# Patient Record
Sex: Female | Born: 1955 | Race: White | Hispanic: Yes | Marital: Single | Smoking: Never smoker
Health system: Southern US, Community
[De-identification: ages and names within clinical notes are randomized; demographics above are authoritative.]

## PROBLEM LIST (undated history)

## (undated) DIAGNOSIS — M1990 Unspecified osteoarthritis, unspecified site: Secondary | ICD-10-CM

## (undated) DIAGNOSIS — E785 Hyperlipidemia, unspecified: Secondary | ICD-10-CM

## (undated) HISTORY — DX: Unspecified osteoarthritis, unspecified site: M19.90

## (undated) HISTORY — PX: ANKLE SURGERY: SHX546

## (undated) HISTORY — DX: Hyperlipidemia, unspecified: E78.5

---

## 1993-05-18 DIAGNOSIS — I639 Cerebral infarction, unspecified: Secondary | ICD-10-CM

## 1993-05-18 HISTORY — DX: Cerebral infarction, unspecified: I63.9

## 2001-10-06 ENCOUNTER — Ambulatory Visit (HOSPITAL_BASED_OUTPATIENT_CLINIC_OR_DEPARTMENT_OTHER): Admission: RE | Admit: 2001-10-06 | Discharge: 2001-10-06 | Payer: Self-pay | Admitting: Orthopedic Surgery

## 2004-04-14 ENCOUNTER — Ambulatory Visit: Payer: Self-pay | Admitting: Family Medicine

## 2004-06-10 ENCOUNTER — Ambulatory Visit: Payer: Self-pay | Admitting: Family Medicine

## 2004-08-30 ENCOUNTER — Emergency Department (HOSPITAL_COMMUNITY): Admission: EM | Admit: 2004-08-30 | Discharge: 2004-08-30 | Payer: Self-pay | Admitting: Emergency Medicine

## 2004-11-27 ENCOUNTER — Ambulatory Visit: Payer: Self-pay | Admitting: Family Medicine

## 2005-01-15 ENCOUNTER — Ambulatory Visit: Payer: Self-pay | Admitting: Family Medicine

## 2005-03-30 ENCOUNTER — Ambulatory Visit: Payer: Self-pay | Admitting: Family Medicine

## 2005-06-16 ENCOUNTER — Ambulatory Visit: Payer: Self-pay | Admitting: Family Medicine

## 2005-08-04 ENCOUNTER — Ambulatory Visit: Payer: Self-pay | Admitting: Family Medicine

## 2005-09-22 ENCOUNTER — Ambulatory Visit: Payer: Self-pay | Admitting: Family Medicine

## 2005-12-21 ENCOUNTER — Ambulatory Visit: Payer: Self-pay | Admitting: Family Medicine

## 2006-01-25 ENCOUNTER — Ambulatory Visit: Payer: Self-pay | Admitting: Family Medicine

## 2006-03-11 ENCOUNTER — Ambulatory Visit: Payer: Self-pay | Admitting: Family Medicine

## 2006-04-27 ENCOUNTER — Ambulatory Visit: Payer: Self-pay | Admitting: Family Medicine

## 2006-05-14 ENCOUNTER — Ambulatory Visit: Payer: Self-pay | Admitting: Gastroenterology

## 2006-05-19 ENCOUNTER — Ambulatory Visit: Payer: Self-pay | Admitting: Family Medicine

## 2006-05-24 ENCOUNTER — Ambulatory Visit: Payer: Self-pay | Admitting: Gastroenterology

## 2006-05-24 ENCOUNTER — Ambulatory Visit (HOSPITAL_COMMUNITY): Admission: RE | Admit: 2006-05-24 | Discharge: 2006-05-24 | Payer: Self-pay | Admitting: Gastroenterology

## 2006-05-31 ENCOUNTER — Ambulatory Visit (HOSPITAL_COMMUNITY): Admission: RE | Admit: 2006-05-31 | Discharge: 2006-05-31 | Payer: Self-pay | Admitting: Gastroenterology

## 2006-06-07 ENCOUNTER — Ambulatory Visit: Payer: Self-pay | Admitting: Family Medicine

## 2006-07-19 ENCOUNTER — Ambulatory Visit: Payer: Self-pay | Admitting: Family Medicine

## 2006-10-19 ENCOUNTER — Ambulatory Visit: Payer: Self-pay | Admitting: Family Medicine

## 2008-10-09 ENCOUNTER — Emergency Department (HOSPITAL_COMMUNITY): Admission: EM | Admit: 2008-10-09 | Discharge: 2008-10-09 | Payer: Self-pay | Admitting: Emergency Medicine

## 2008-10-16 ENCOUNTER — Ambulatory Visit (HOSPITAL_COMMUNITY): Admission: RE | Admit: 2008-10-16 | Discharge: 2008-10-16 | Payer: Self-pay | Admitting: Family Medicine

## 2010-08-26 LAB — URINE MICROSCOPIC-ADD ON

## 2010-08-26 LAB — URINALYSIS, ROUTINE W REFLEX MICROSCOPIC
Bilirubin Urine: NEGATIVE
Glucose, UA: NEGATIVE mg/dL
Ketones, ur: NEGATIVE mg/dL
pH: 7.5 (ref 5.0–8.0)

## 2010-08-26 LAB — DIFFERENTIAL
Basophils Absolute: 0 10*3/uL (ref 0.0–0.1)
Eosinophils Absolute: 0.2 10*3/uL (ref 0.0–0.7)
Eosinophils Relative: 3 % (ref 0–5)
Lymphocytes Relative: 12 % (ref 12–46)
Monocytes Absolute: 0.5 10*3/uL (ref 0.1–1.0)
Neutro Abs: 5.5 10*3/uL (ref 1.7–7.7)
Neutrophils Relative %: 77 % (ref 43–77)

## 2010-08-26 LAB — BASIC METABOLIC PANEL
BUN: 8 mg/dL (ref 6–23)
CO2: 28 mEq/L (ref 19–32)
Calcium: 9.2 mg/dL (ref 8.4–10.5)
Chloride: 105 mEq/L (ref 96–112)
Creatinine, Ser: 0.79 mg/dL (ref 0.4–1.2)
Potassium: 4.5 mEq/L (ref 3.5–5.1)

## 2010-08-26 LAB — CBC
MCHC: 35.9 g/dL (ref 30.0–36.0)
MCV: 90 fL (ref 78.0–100.0)
RBC: 4.46 MIL/uL (ref 3.87–5.11)
RDW: 12.5 % (ref 11.5–15.5)

## 2010-10-03 NOTE — Op Note (Signed)
NAMEMARRAH, VANEVERY              ACCOUNT NO.:  [id number]   MEDICAL RECORD NO.:  [id number]          PATIENT TYPE:  AMB   LOCATION:  DAY                           FACILITY:  [facility]   PHYSICIAN:  Kassie Mends, M.D.      DATE OF BIRTH:  03-Apr-1956   DATE OF PROCEDURE:  DATE OF DISCHARGE:  05/31/2006                               OPERATIVE REPORT   [employer] Study   INDICATIONS FOR PROCEDURE:  Ms. Zehring is a 55 year old female who was  found to have microcytic anemia with a hemoglobin of 10.5 and hemoccult  positive stools. She is on Mobic and ibuprofen as an outpatient. She  also has history of menorrhagia. She underwent colonoscopy and EGD by  Dr. Cira Servant on May 24, 2006. She was found to have a normal colonoscopy  and EGD. Small bowel [employer] Study is being performed to complete  evaluation of GI tract, given hemoccult positive stool. Gastric pass at  this time was 1 hour 21 minutes with first duodenal image at 1 hour 23  minutes and 6 seconds. There was no evidence of actual GI bleeding  throughout the small bowel study. First cecal image was at 5 hours 15  minutes and 3 seconds. Small bowel transient time was 3 hours 53  minutes.   SUMMARY AND RECOMMENDATIONS:  Normal small bowel [employer]  endoscopy without any evidence of active GI bleeding. Her anemia is most  likely explained by her menorrhagia. She is to follow up with her PCP as  far as serial hemoglobin and Iron therapy.      Nicholas Lose, N.P.      Kassie Mends, M.D.  Electronically Signed    KC/MEDQ  D:  06/07/2006  T:  06/07/2006  Job:  [id number]   cc:   Delaney Meigs, M.D.  Fax: [phone]

## 2010-10-03 NOTE — Op Note (Signed)
Gwendolyn Ortiz, Gwendolyn Ortiz              ACCOUNT NO.:  [id number]   MEDICAL RECORD NO.:  [id number]          PATIENT TYPE:  AMB   LOCATION:  DAY                           FACILITY:  [facility]   PHYSICIAN:  Kassie Mends, M.D.      DATE OF BIRTH:  Oct 06, 1955   DATE OF PROCEDURE:  05/24/2006  DATE OF DISCHARGE:                               OPERATIVE REPORT   REFERRING PHYSICIAN:  Delaney Meigs, M.D.   OPERATION/PROCEDURE:  1. Colonoscopy.  2. Esophagogastroduodenoscopy.   INDICATIONS:  Gwendolyn Ortiz is a 55 year old female who presents with  hemoglobin of 10.5 with an MCV of 73 and heme positive stools.  She is  maintained on Mobic and ibuprofen as an outpatient.  She also has  history of heavy menses.  She currently is menstruating.   FINDINGS:  1. Normal colon without evidence of polyps, masses, inflammatory      changes, diverticula, or AVMs.  2. Normal retroflexed view of the rectum.  3. Normal esophagus without evidence of Barrett's.  Normal stomach and      duodenum.   RECOMMENDATIONS:  1. Resume previous diet.  2. Screening colonoscopy in 10 years.  3. Resume previous medications.  4. Should hold iron until capsule endoscopy is complete.  5. Will schedule capsule endoscopy in one week.  The likely source of      her microcytic anemia is menstrual blood loss.  Will schedule      capsule endoscopy to complete evaluation of GI tract since she did      have heme positive stool.  6. Follow up with Dr. Joette Catching.   MEDICATIONS:  1. Colonoscopy.  Demerol 100 mg IV, Versed 4 mg IV.  2. Esophagogastroduodenoscopy.  Versed 2 mg IV.   DESCRIPTION OF PROCEDURE:  Physical examination was performed and  informed consent was obtained from the patient after explaining the  patient's risks and alternatives to the procedure.  The patient was  connected to the monitor and placed in the left lateral position.  Continuous oxygen was provided by nasal cannula and IV medicine  administered  through the indwelling cannula.  After administration of  sedation and rectal exam, the patient's rectum was intubated and scope  was advanced under direct visualization to the terminal ileum.  The  scope was subsequently removed slowly by carefully examining the color  and texture, anatomy, and  integrity mucosa on the way out.   After the colonoscopy, the patient's esophagus was intubated with a  diagnostic gastroscope.  The scope was advanced under direct  visualization to the second portion of the duodenum.  The scope was  subsequently removed slowly by carefully examining the color, texture,  anatomy, integrity of the mucosa on the way out.  The patient was  recovered in the endoscopy suite and discharged home in satisfactory  condition.   DISCHARGE INSTRUCTIONS:  Discharge instructions were given to Gwendolyn Ortiz  via her cousin who speaks [place].      Kassie Mends, M.D.  Electronically Signed     SM/MEDQ  D:  05/24/2006  T:  05/24/2006  Job:  [id number]   cc:   Delaney Meigs, M.D.  Fax: [phone]

## 2010-10-03 NOTE — Op Note (Signed)
Gwendolyn Ortiz. [facility]  Patient:    Gwendolyn Ortiz, Gwendolyn Ortiz Visit Number: [id number] MRN: [id number]          Service Type: DSU Location: [facility] Attending Physician:  Cornell Barman Dictated by:   Lenard Galloway Chaney Malling, M.D. Proc. Date: 10/06/01 Admit Date:  10/06/2001 Discharge Date: 10/06/2001                             Operative Report  PREOPERATIVE DIAGNOSIS:  Metal side plate and syndesmosis screw to fracture, right ankle.  POSTOPERATIVE DIAGNOSIS:  Metal side plate and syndesmosis screw to fracture, right ankle.  PROCEDURE:  Remove side plate and syndesmosis screws of right ankle.  SURGEON:  Lenard Galloway. Chaney Malling, M.D.  ANESTHESIA:  General.  DRAINS:  None.  COMPLICATIONS:  None.  DESCRIPTION OF PROCEDURE:  Patient placed on the operating table in supine position with the pneumatic tourniquet about the right upper thigh.  After satisfactory general anesthesia, the right lower extremity was prepped with Duraprep and draped out in the usual manner.  The leg was then wrapped out with an Esmarch and tourniquet was elevated.  An incision was made over the fibula through the previous incision.  I believe these were coagulated. Dissection was then carried down to the side plate with soft tissues stripped off the side plate.  All the screws were removed except for the syndesmosis screw which had been stripped previously.  The Easy-Out was then used and the syndesmosis screw was easily removed.  The side plate was then removed.  All the hardware was removed quite easily.  The wound was irrigated with saline solution. Vicryl 2-0 was used to close the subcutaneous tissue and stainless steel staples used to close the skin.  Sterile dressings were applied and the patient returned to the recovery room in excellent condition.  Technically this procedure went extremely well. Dictated by:   Lenard Galloway Chaney Malling, M.D. Attending Physician:  Cornell Barman DD:  10/06/01 TD:  10/08/01 Job: [id number]

## 2010-10-03 NOTE — Consult Note (Signed)
Gwendolyn Ortiz, Gwendolyn Ortiz              ACCOUNT NO.:  [id number]   MEDICAL RECORD NO.:  [id number]         PATIENT TYPE:  AMB   LOCATION:                                FACILITY:  [facility]   PHYSICIAN:  Kassie Mends, M.D.      DATE OF BIRTH:  November 20, 1955   DATE OF CONSULTATION:  05/14/2006  DATE OF DISCHARGE:                                 CONSULTATION   REASON FOR CONSULTATION:  Anemia and heme positive stools.   REFERRING PHYSICIAN:  Delaney Meigs, M.D.   HISTORY OF PRESENT ILLNESS:  Gwendolyn Ortiz is a 55 year old Hispanic female  who speaks limited English and is accompanied by her cousin who  interprets today.  She has a history of iron deficiency anemia and heme  positive stools.  She denies any constipation, diarrhea, melena, or  rectal bleeding.  She has had some mid abdominal pain for about 3 weeks,  but denies any alleviating or modifying factors.  It was does not seem  to be related to bowels or meals.  She says she has not had a menstrual  cycle in 2 months, but is not sexually active and is not concerned about  being pregnant.  She had been having heavy flow for 7 days at a time  each month with her cycle previously.  She denies any vomiting, she has  had some nausea.  She complains of heartburn.  Denies any dysphagia,  odynophagia, or weight loss.  She never had an EGD or colonoscopy.  She  has been on Mobic for a while and recently has started ibuprofen for the  last 3 weeks.  This was given to her for dysmenorrhea.   Her hemoglobin is 10.5, hematocrit 34.6, MCV 73, iron 15, iron  saturation 4%, TIBC 382.  LFT's normal, TSH normal, hemoccult positive  x3.   CURRENT MEDICATIONS:  1. Iron one pill daily recently started.  2. Ibuprofen 600 mg t.i.d.  3. Effexor XR 150 mg daily.  4. Mobic 7.5 mg b.i.d.   ALLERGIES:  No known drug allergies.   PAST MEDICAL HISTORY:  Dysmenorrhea, anemia, fibromyalgia, depression,  history of stroke in 1995, headache, residual left-sided  weakness.  She  has had surgery on her right foot.   FAMILY HISTORY:  The mother died at age 72 with kidney cancer.  Father  has heart disease and is 90 years old.  No family history of colorectal  cancer or peptic ulcer disease.   SOCIAL HISTORY:  She is single with no children.  She is unemployed.  She has never been a smoker.  No alcohol use.   REVIEW OF SYSTEMS:  See HPI for GI and constitutional.  CARDIOPULMONARY:  Denies chest pain or shortness of breath.   PHYSICAL EXAMINATION:  VITAL SIGNS:  Weight 175, height 5 feet 3 inches,  temperature 98.5, blood pressure 132/80, pulse 64.  GENERAL:  A pleasant, well-nourished, well-developed, Hispanic female in  no acute distress. SKIN:  Warm and dry with no jaundice. HEENT:  Oropharyngeal mucosa moist and pink.  Sclerae anicteric.  No  lymphadenopathy or thyromegaly.  CHEST:  Lungs are clear to auscultation.  HEART:  Regular rate and  rhythm.  Normal S1 and S2.  No murmurs, rubs, or gallops.  ABDOMEN:  Positive bowel sounds.  Abdomen is soft and nondistended.  The majority  of her tenderness is in the lower abdomen to deep palpation as well as  epigastrium.  No rebound tenderness or guarding.  No organomegaly or  masses appreciated. EXTREMITIES:  No edema.   IMPRESSION:  Gwendolyn Ortiz is a 55 year old lady with iron deficiency anemia  and heme positive stools.  She complains of GERD, nausea, epigastric  pain.  She also has some mid abdominal pain.  She has some dysmenorrhea  and has had heavy blood flow, but no menstrual cycle in 2 months.  She  denies sexual activity.  With regards to her iron deficiency anemia and  heme positive stools, she needs to have a colonoscopy and EGD for  further evaluation.  This was discussed at length with regards to risks,  benefits, and the patient is agreeable to proceed.   PLAN:  1. Colonoscopy and EGD in the near future with Dr. Cira Servant.  We will      hold her iron for 7 days prior to the procedure.   2. Further recommendations to follow.      Tana Coast, P.A.      Kassie Mends, M.D.  Electronically Signed    LL/MEDQ  D:  05/14/2006  T:  05/14/2006  Job:  [id number]   cc:   Delaney Meigs, M.D.  Fax: [phone]

## 2010-12-24 ENCOUNTER — Ambulatory Visit (HOSPITAL_COMMUNITY)
Admission: RE | Admit: 2010-12-24 | Discharge: 2010-12-24 | Disposition: A | Discharge status: Home / Self Care | Payer: Medicaid Other | Source: Ambulatory Visit | Attending: Family Medicine | Admitting: Family Medicine

## 2010-12-24 ENCOUNTER — Other Ambulatory Visit (HOSPITAL_COMMUNITY): Payer: Self-pay | Admitting: Adult Health Nurse Practitioner

## 2010-12-24 DIAGNOSIS — M47817 Spondylosis without myelopathy or radiculopathy, lumbosacral region: Secondary | ICD-10-CM | POA: Insufficient documentation

## 2010-12-24 DIAGNOSIS — W19XXXA Unspecified fall, initial encounter: Secondary | ICD-10-CM

## 2010-12-24 DIAGNOSIS — M25559 Pain in unspecified hip: Secondary | ICD-10-CM | POA: Insufficient documentation

## 2010-12-24 DIAGNOSIS — M25519 Pain in unspecified shoulder: Secondary | ICD-10-CM | POA: Insufficient documentation

## 2010-12-24 DIAGNOSIS — M47812 Spondylosis without myelopathy or radiculopathy, cervical region: Secondary | ICD-10-CM | POA: Insufficient documentation

## 2013-04-04 DIAGNOSIS — G9389 Other specified disorders of brain: Secondary | ICD-10-CM | POA: Insufficient documentation

## 2014-09-12 DIAGNOSIS — Q282 Arteriovenous malformation of cerebral vessels: Secondary | ICD-10-CM | POA: Insufficient documentation

## 2016-04-21 ENCOUNTER — Telehealth: Payer: Self-pay | Admitting: Gastroenterology

## 2016-04-21 NOTE — Telephone Encounter (Signed)
Recall for tcs °

## 2016-04-21 NOTE — Telephone Encounter (Signed)
Letter mailed to pt.  

## 2017-03-29 DIAGNOSIS — Z1231 Encounter for screening mammogram for malignant neoplasm of breast: Secondary | ICD-10-CM | POA: Diagnosis not present

## 2017-05-27 DIAGNOSIS — Z124 Encounter for screening for malignant neoplasm of cervix: Secondary | ICD-10-CM | POA: Insufficient documentation

## 2017-08-26 DIAGNOSIS — F3342 Major depressive disorder, recurrent, in full remission: Secondary | ICD-10-CM | POA: Insufficient documentation

## 2017-12-09 DIAGNOSIS — F3342 Major depressive disorder, recurrent, in full remission: Secondary | ICD-10-CM | POA: Diagnosis not present

## 2017-12-09 DIAGNOSIS — E785 Hyperlipidemia, unspecified: Secondary | ICD-10-CM | POA: Diagnosis not present

## 2018-01-27 DIAGNOSIS — Q282 Arteriovenous malformation of cerebral vessels: Secondary | ICD-10-CM | POA: Diagnosis not present

## 2018-01-27 DIAGNOSIS — J329 Chronic sinusitis, unspecified: Secondary | ICD-10-CM | POA: Diagnosis not present

## 2018-01-27 DIAGNOSIS — H6692 Otitis media, unspecified, left ear: Secondary | ICD-10-CM | POA: Diagnosis not present

## 2018-02-09 DIAGNOSIS — H04123 Dry eye syndrome of bilateral lacrimal glands: Secondary | ICD-10-CM | POA: Diagnosis not present

## 2018-02-09 DIAGNOSIS — H2513 Age-related nuclear cataract, bilateral: Secondary | ICD-10-CM | POA: Diagnosis not present

## 2018-02-09 DIAGNOSIS — H353111 Nonexudative age-related macular degeneration, right eye, early dry stage: Secondary | ICD-10-CM | POA: Diagnosis not present

## 2018-02-09 DIAGNOSIS — H53462 Homonymous bilateral field defects, left side: Secondary | ICD-10-CM | POA: Diagnosis not present

## 2018-04-12 DIAGNOSIS — J309 Allergic rhinitis, unspecified: Secondary | ICD-10-CM | POA: Diagnosis not present

## 2018-04-12 DIAGNOSIS — Z23 Encounter for immunization: Secondary | ICD-10-CM | POA: Diagnosis not present

## 2018-04-12 DIAGNOSIS — Q282 Arteriovenous malformation of cerebral vessels: Secondary | ICD-10-CM | POA: Diagnosis not present

## 2018-04-12 DIAGNOSIS — F3342 Major depressive disorder, recurrent, in full remission: Secondary | ICD-10-CM | POA: Diagnosis not present

## 2018-04-12 DIAGNOSIS — Z1239 Encounter for other screening for malignant neoplasm of breast: Secondary | ICD-10-CM | POA: Diagnosis not present

## 2018-04-12 DIAGNOSIS — E785 Hyperlipidemia, unspecified: Secondary | ICD-10-CM | POA: Diagnosis not present

## 2018-05-04 DIAGNOSIS — Q282 Arteriovenous malformation of cerebral vessels: Secondary | ICD-10-CM | POA: Diagnosis not present

## 2019-03-08 ENCOUNTER — Ambulatory Visit: Payer: Self-pay | Admitting: Family Medicine

## 2019-03-31 ENCOUNTER — Ambulatory Visit: Payer: Medicaid Other | Admitting: Family Medicine

## 2019-03-31 ENCOUNTER — Encounter: Payer: Self-pay | Admitting: Family Medicine

## 2019-03-31 ENCOUNTER — Other Ambulatory Visit: Payer: Self-pay

## 2019-03-31 VITALS — BP 138/89 | HR 96 | Temp 99.1°F | Ht 60.0 in | Wt 156.2 lb

## 2019-03-31 DIAGNOSIS — Z13220 Encounter for screening for lipoid disorders: Secondary | ICD-10-CM | POA: Diagnosis not present

## 2019-03-31 DIAGNOSIS — F3342 Major depressive disorder, recurrent, in full remission: Secondary | ICD-10-CM

## 2019-03-31 DIAGNOSIS — M797 Fibromyalgia: Secondary | ICD-10-CM

## 2019-03-31 DIAGNOSIS — Z131 Encounter for screening for diabetes mellitus: Secondary | ICD-10-CM | POA: Diagnosis not present

## 2019-03-31 MED ORDER — VENLAFAXINE HCL ER 37.5 MG PO CP24: ORAL_CAPSULE | ORAL | 0 refills | Status: DC

## 2019-03-31 NOTE — Progress Notes (Signed)
BP 138/89   Pulse 96   Temp 99.1 F (37.3 C) (Temporal)   Ht 5' (1.524 m)   Wt 156 lb 3.2 oz (70.9 kg)   SpO2 96%   BMI 30.51 kg/m    Subjective:    Patient ID: Gwendolyn Ortiz, female    DOB: 1956-04-09, 63 y.o.   MRN: [id number]  HPI: Caliann Leckrone is a 63 y.o. female presenting on 03/31/2019 for New Patient (Initial Visit) ([facility]) and Establish Care   HPI Patient is coming in to establish care with our office today.  She says she has been diagnosed with possible fibromyalgia versus depression versus anxiety in the past.  She had been on Effexor for many many years and thinks it may have gotten started around the time that she had a stroke many years ago but she does not know if it is do anything for her.  She has started taking it every other day because of stomach issues.  She still has some myalgias but denies any signs of anxiety or depression currently. Depression screen PHQ 2/9 03/31/2019  Decreased Interest 0  Down, Depressed, Hopeless 0  PHQ - 2 Score 0    Patient has a history of AV malformation with possible history of stroke in the past that has been monitored by neurology and has been stable  Relevant past medical, surgical, family and social history reviewed and updated as indicated. Interim medical history since our last visit reviewed. Allergies and medications reviewed and updated.  Review of Systems  Constitutional: Negative for chills and fever.  HENT: Negative for congestion.   Eyes: Negative for redness and visual disturbance.  Respiratory: Negative for chest tightness and shortness of breath.   Cardiovascular: Negative for chest pain and leg swelling.  Genitourinary: Negative for difficulty urinating and dysuria.  Musculoskeletal: Positive for myalgias. Negative for back pain and gait problem.  Skin: Negative for rash.  Neurological: Negative for weakness, light-headedness, numbness and headaches.  Psychiatric/Behavioral: Negative for agitation,  behavioral problems, dysphoric mood, self-injury, sleep disturbance and suicidal ideas. The patient is not nervous/anxious.   All other systems reviewed and are negative.   Per HPI unless specifically indicated above  Social History   Socioeconomic History  . Marital status: Single    Spouse name: Not on file  . Number of children: Not on file  . Years of education: Not on file  . Highest education level: Not on file  Occupational History  . Not on file  Social Needs  . Financial resource strain: Not on file  . Food insecurity    Worry: Not on file    Inability: Not on file  . Transportation needs    Medical: Not on file    Non-medical: Not on file  Tobacco Use  . Smoking status: Never Smoker  . Smokeless tobacco: Never Used  Substance and Sexual Activity  . Alcohol use: Yes    Comment: occ  . Drug use: Never  . Sexual activity: Not on file  Lifestyle  . Physical activity    Days per week: Not on file    Minutes per session: Not on file  . Stress: Not on file  Relationships  . Social Herbalist on phone: Not on file    Gets together: Not on file    Attends religious service: Not on file    Active member of club or organization: Not on file    Attends meetings of clubs or organizations: Not  on file    Relationship status: Not on file  . Intimate partner violence    Fear of current or ex partner: Not on file    Emotionally abused: Not on file    Physically abused: Not on file    Forced sexual activity: Not on file  Other Topics Concern  . Not on file  Social History Narrative  . Not on file    Past Surgical History:  Procedure Laterality Date  . ANKLE SURGERY Right     Family History  Problem Relation Age of Onset  . Heart attack Father   . Hypertension Father     Allergies as of 03/31/2019   No Known Allergies     Medication List       Accurate as of March 31, 2019  9:49 AM. If you have any questions, ask your nurse or doctor.         aspirin EC 81 MG tablet Take 81 mg by mouth daily.   DAILY MULTI 50+ PO Take by mouth.   Tylenol 8 Hour Arthritis Pain 650 MG CR tablet Generic drug: acetaminophen Take 650 mg by mouth every 8 (eight) hours as needed for pain.   venlafaxine XR 150 MG 24 hr capsule Commonly known as: EFFEXOR-XR Take 150 mg by mouth daily with breakfast. 1 tablet every 3rd day   VITAMIN C GUMMIE PO Take by mouth.          Objective:    BP 138/89   Pulse 96   Temp 99.1 F (37.3 C) (Temporal)   Ht 5' (1.524 m)   Wt 156 lb 3.2 oz (70.9 kg)   SpO2 96%   BMI 30.51 kg/m   Wt Readings from Last 3 Encounters:  03/31/19 156 lb 3.2 oz (70.9 kg)    Physical Exam Vitals signs and nursing note reviewed.  Constitutional:      General: She is not in acute distress.    Appearance: She is well-developed. She is not diaphoretic.  Eyes:     Conjunctiva/sclera: Conjunctivae normal.  Cardiovascular:     Rate and Rhythm: Normal rate and regular rhythm.     Heart sounds: Normal heart sounds. No murmur.  Pulmonary:     Effort: Pulmonary effort is normal. No respiratory distress.     Breath sounds: Normal breath sounds. No wheezing.  Abdominal:     General: Abdomen is flat. Bowel sounds are normal. There is no distension.     Tenderness: There is no abdominal tenderness. There is no right CVA tenderness, left CVA tenderness or guarding.  Musculoskeletal: Normal range of motion.        General: No tenderness.  Skin:    General: Skin is warm and dry.     Findings: No rash.  Neurological:     Mental Status: She is alert and oriented to person, place, and time.     Coordination: Coordination normal.  Psychiatric:        Behavior: Behavior normal.         Assessment & Plan:   Problem List Items Addressed This Visit      Other   Recurrent major depression in full remission ([place]) - Primary   Relevant Medications   venlafaxine XR (EFFEXOR XR) 37.5 MG 24 hr capsule   Other Relevant Orders    CBC with Differential/Platelet   TSH    Other Visit Diagnoses    Diabetes mellitus screening       Relevant Orders  CMP14+EGFR   Lipid screening       Relevant Orders   Lipid panel   Fibromyalgia       Relevant Medications   aspirin EC 81 MG tablet   acetaminophen (TYLENOL 8 HOUR ARTHRITIS PAIN) 650 MG CR tablet   venlafaxine XR (EFFEXOR XR) 37.5 MG 24 hr capsule      Will slowly taper off the Effexor and see how she goes and then will come back in a couple months and see how her fibromyalgia and depression and anxiety are doing.  If she has any issues in the meantime she will call sooner. Follow up plan: Return in about 8 weeks (around 05/29/2019), or if symptoms worsen or fail to improve, for fibromyalgia.  Caryl Pina, MD [facility] 03/31/2019, 9:49 AM

## 2019-04-01 LAB — CMP14+EGFR
ALT: 25 IU/L (ref 0–32)
AST: 30 IU/L (ref 0–40)
Albumin/Globulin Ratio: 1.6 (ref 1.2–2.2)
Albumin: 4.4 g/dL (ref 3.8–4.8)
Alkaline Phosphatase: 93 IU/L (ref 39–117)
BUN/Creatinine Ratio: 16 (ref 12–28)
BUN: 12 mg/dL (ref 8–27)
Bilirubin Total: 0.4 mg/dL (ref 0.0–1.2)
CO2: 25 mmol/L (ref 20–29)
Calcium: 9.2 mg/dL (ref 8.7–10.3)
Chloride: 104 mmol/L (ref 96–106)
Creatinine, Ser: 0.76 mg/dL (ref 0.57–1.00)
GFR calc Af Amer: 97 mL/min/{1.73_m2} (ref >59)
GFR calc non Af Amer: 84 mL/min/{1.73_m2} (ref >59)
Globulin, Total: 2.8 g/dL (ref 1.5–4.5)
Glucose: 100 mg/dL — ABNORMAL HIGH (ref 65–99)
Potassium: 4.2 mmol/L (ref 3.5–5.2)
Sodium: 142 mmol/L (ref 134–144)
Total Protein: 7.2 g/dL (ref 6.0–8.5)

## 2019-04-01 LAB — CBC WITH DIFFERENTIAL/PLATELET
Basophils Absolute: 0.1 10*3/uL (ref 0.0–0.2)
Basos: 1 %
EOS (ABSOLUTE): 0.2 10*3/uL (ref 0.0–0.4)
Eos: 5 %
Hematocrit: 43.4 % (ref 34.0–46.6)
Hemoglobin: 14.9 g/dL (ref 11.1–15.9)
Immature Grans (Abs): 0 10*3/uL (ref 0.0–0.1)
Immature Granulocytes: 0 %
Lymphocytes Absolute: 1.6 10*3/uL (ref 0.7–3.1)
Lymphs: 35 %
MCH: 30.5 pg (ref 26.6–33.0)
MCHC: 34.3 g/dL (ref 31.5–35.7)
MCV: 89 fL (ref 79–97)
Monocytes Absolute: 0.3 10*3/uL (ref 0.1–0.9)
Monocytes: 7 %
Neutrophils Absolute: 2.4 10*3/uL (ref 1.4–7.0)
Neutrophils: 52 %
Platelets: 270 10*3/uL (ref 150–450)
RBC: 4.88 x10E6/uL (ref 3.77–5.28)
RDW: 12.2 % (ref 11.7–15.4)
WBC: 4.7 10*3/uL (ref 3.4–10.8)

## 2019-04-01 LAB — LIPID PANEL
Chol/HDL Ratio: 3.7 ratio (ref 0.0–4.4)
Cholesterol, Total: 193 mg/dL (ref 100–199)
HDL: 52 mg/dL (ref >39)
LDL Chol Calc (NIH): 122 mg/dL — ABNORMAL HIGH (ref 0–99)
Triglycerides: 107 mg/dL (ref 0–149)
VLDL Cholesterol Cal: 19 mg/dL (ref 5–40)

## 2019-04-01 LAB — TSH: TSH: 2.48 u[IU]/mL (ref 0.450–4.500)

## 2019-04-03 DIAGNOSIS — Z23 Encounter for immunization: Secondary | ICD-10-CM | POA: Diagnosis not present

## 2019-05-03 DIAGNOSIS — Q282 Arteriovenous malformation of cerebral vessels: Secondary | ICD-10-CM | POA: Diagnosis not present

## 2019-05-20 ENCOUNTER — Other Ambulatory Visit: Payer: Self-pay | Admitting: Family Medicine

## 2019-05-22 DIAGNOSIS — Z923 Personal history of irradiation: Secondary | ICD-10-CM | POA: Diagnosis not present

## 2019-05-22 DIAGNOSIS — Q282 Arteriovenous malformation of cerebral vessels: Secondary | ICD-10-CM | POA: Diagnosis not present

## 2019-05-22 DIAGNOSIS — Z9889 Other specified postprocedural states: Secondary | ICD-10-CM | POA: Diagnosis not present

## 2019-05-27 DIAGNOSIS — Z20828 Contact with and (suspected) exposure to other viral communicable diseases: Secondary | ICD-10-CM | POA: Diagnosis not present

## 2019-06-02 DIAGNOSIS — Q282 Arteriovenous malformation of cerebral vessels: Secondary | ICD-10-CM | POA: Diagnosis not present

## 2019-06-09 ENCOUNTER — Other Ambulatory Visit: Payer: Self-pay

## 2019-06-12 ENCOUNTER — Other Ambulatory Visit: Payer: Self-pay

## 2019-06-12 ENCOUNTER — Encounter: Payer: Self-pay | Admitting: Family Medicine

## 2019-06-12 ENCOUNTER — Ambulatory Visit (INDEPENDENT_AMBULATORY_CARE_PROVIDER_SITE_OTHER): Payer: Medicaid Other | Admitting: Family Medicine

## 2019-06-12 VITALS — BP 139/81 | HR 106 | Temp 98.9°F | Ht 61.0 in | Wt 155.6 lb

## 2019-06-12 DIAGNOSIS — F3342 Major depressive disorder, recurrent, in full remission: Secondary | ICD-10-CM

## 2019-06-12 NOTE — Progress Notes (Signed)
BP 139/81   Pulse (!) 106   Temp 98.9 F (37.2 C) (Temporal)   Ht 5\' 1"  (1.549 m)   Wt 155 lb 9.6 oz (70.6 kg)   SpO2 97%   BMI 29.40 kg/m    Subjective:   Patient ID: Gwendolyn Ortiz, female    DOB: 02/22/1956, 64 y.o.   MRN: [id number]  HPI: Gwendolyn Ortiz is a 64 y.o. female presenting on 06/12/2019 for Fibromyalgia (8 week follow up)   HPI Patient is coming in for recheck of fibromyalgia and anxiety, she been on Effexor for many years did not know if it was even do anything for and she feels like everything is going good and she denies any depression or muscle aches.  She did have a little bit of a withdrawal coming down off of it but since that first 2 weeks, off of it she has been fine her granddaughter is here with her and is very good support for her.  Relevant past medical, surgical, family and social history reviewed and updated as indicated. Interim medical history since our last visit reviewed. Allergies and medications reviewed and updated.  Review of Systems  Constitutional: Negative for chills and fever.  Eyes: Negative for visual disturbance.  Respiratory: Negative for chest tightness and shortness of breath.   Cardiovascular: Negative for chest pain and leg swelling.  Musculoskeletal: Negative for back pain, gait problem and myalgias.  Skin: Negative for rash.  Neurological: Negative for light-headedness and headaches.  Psychiatric/Behavioral: Negative for agitation, behavioral problems, dysphoric mood, self-injury, sleep disturbance and suicidal ideas. The patient is not nervous/anxious.   All other systems reviewed and are negative.   Per HPI unless specifically indicated above   Allergies as of 06/12/2019   No Known Allergies     Medication List       Accurate as of June 12, 2019  9:43 AM. If you have any questions, ask your nurse or doctor.        STOP taking these medications   venlafaxine XR 37.5 MG 24 hr capsule Commonly known as:  EFFEXOR-XR Stopped by: Fransisca Kaufmann , MD     TAKE these medications   aspirin EC 81 MG tablet Take 81 mg by mouth daily.   DAILY MULTI 50+ PO Take by mouth.   Tylenol 8 Hour Arthritis Pain 650 MG CR tablet Generic drug: acetaminophen Take 650 mg by mouth every 8 (eight) hours as needed for pain.   VITAMIN C GUMMIE PO Take by mouth.        Objective:   BP 139/81   Pulse (!) 106   Temp 98.9 F (37.2 C) (Temporal)   Ht 5\' 1"  (1.549 m)   Wt 155 lb 9.6 oz (70.6 kg)   SpO2 97%   BMI 29.40 kg/m   Wt Readings from Last 3 Encounters:  06/12/19 155 lb 9.6 oz (70.6 kg)  03/31/19 156 lb 3.2 oz (70.9 kg)    Physical Exam Vitals and nursing note reviewed.  Constitutional:      General: She is not in acute distress.    Appearance: She is well-developed. She is not diaphoretic.  Eyes:     Conjunctiva/sclera: Conjunctivae normal.  Cardiovascular:     Rate and Rhythm: Normal rate and regular rhythm.     Heart sounds: Normal heart sounds. No murmur.  Pulmonary:     Effort: Pulmonary effort is normal. No respiratory distress.     Breath sounds: Normal breath sounds. No wheezing.  Skin:  General: Skin is warm and dry.     Findings: No rash.  Neurological:     Mental Status: She is alert and oriented to person, place, and time.     Coordination: Coordination normal.  Psychiatric:        Mood and Affect: Mood normal.        Behavior: Behavior normal.        Thought Content: Thought content normal.       Assessment & Plan:   Problem List Items Addressed This Visit      Other   Recurrent major depression in full remission ([facility]) - Primary     Depression screen [facility] 2/9 06/12/2019 03/31/2019  Decreased Interest 0 0  Down, Depressed, Hopeless 0 0  PHQ - 2 Score 0 0      Patient had angiography and they did not see the AVM or any AVM so I do not know if they want her to follow-up with another MRI in a year or not, she is doing great off of the Effexor and she  feels great so we will not change anything there and just have her come back in November for physical.  Follow up plan: Return in about 10 months (around 04/11/2020), or if symptoms worsen or fail to improve, for Yearly physical.  Counseling provided for all of the vaccine components No orders of the defined types were placed in this encounter.   Arville Care, MD [facility] 06/12/2019, 9:43 AM

## 2019-07-10 DIAGNOSIS — Q282 Arteriovenous malformation of cerebral vessels: Secondary | ICD-10-CM | POA: Diagnosis not present

## 2019-07-10 DIAGNOSIS — Z923 Personal history of irradiation: Secondary | ICD-10-CM | POA: Diagnosis not present

## 2019-07-10 DIAGNOSIS — Z8774 Personal history of (corrected) congenital malformations of heart and circulatory system: Secondary | ICD-10-CM | POA: Diagnosis not present

## 2019-07-10 DIAGNOSIS — H547 Unspecified visual loss: Secondary | ICD-10-CM | POA: Diagnosis not present

## 2020-03-18 ENCOUNTER — Other Ambulatory Visit: Payer: Self-pay

## 2020-03-18 ENCOUNTER — Encounter: Payer: Self-pay | Admitting: Family Medicine

## 2020-03-18 ENCOUNTER — Ambulatory Visit: Payer: Medicaid Other | Admitting: Family Medicine

## 2020-03-18 VITALS — BP 139/88 | HR 82 | Temp 98.2°F | Ht 61.0 in | Wt 135.4 lb

## 2020-03-18 DIAGNOSIS — Z1231 Encounter for screening mammogram for malignant neoplasm of breast: Secondary | ICD-10-CM

## 2020-03-18 DIAGNOSIS — Z131 Encounter for screening for diabetes mellitus: Secondary | ICD-10-CM

## 2020-03-18 DIAGNOSIS — I10 Essential (primary) hypertension: Secondary | ICD-10-CM | POA: Diagnosis not present

## 2020-03-18 DIAGNOSIS — Z13220 Encounter for screening for lipoid disorders: Secondary | ICD-10-CM | POA: Diagnosis not present

## 2020-03-18 DIAGNOSIS — Z23 Encounter for immunization: Secondary | ICD-10-CM | POA: Diagnosis not present

## 2020-03-18 DIAGNOSIS — Z1159 Encounter for screening for other viral diseases: Secondary | ICD-10-CM

## 2020-03-18 DIAGNOSIS — Z114 Encounter for screening for human immunodeficiency virus [HIV]: Secondary | ICD-10-CM

## 2020-03-18 LAB — CBC WITH DIFFERENTIAL/PLATELET: MCH: 30.8 pg (ref 26.6–33.0)

## 2020-03-18 LAB — CMP14+EGFR
AST: 24 IU/L (ref 0–40)
Calcium: 9.7 mg/dL (ref 8.7–10.3)

## 2020-03-18 NOTE — Progress Notes (Signed)
BP 139/88   Pulse 82   Temp 98.2 F (36.8 C)   Ht _0  (1.549 m)   Wt 135 lb 6.4 oz (61.4 kg)   SpO2 96%   BMI 25.58 kg/m    Subjective:   Patient ID: Gwendolyn Ortiz, female    DOB: 07/19/1955, 64 y.o.   MRN: [id number]  HPI: Gwendolyn Ortiz is a 64 y.o. female presenting on 03/18/2020 for Medical Management of Chronic Issues   HPI Hypertension Patient is currently on no medication and we are monitoring, it seems like she is running in the high 130s and low 140s, and their blood pressure today is 139/88. Patient denies any lightheadedness or dizziness. Patient denies headaches, blurred vision, chest pains, shortness of breath, or weakness. Denies any side effects from medication and is content with current medication.   Patient is also coming in for screening today  Relevant past medical, surgical, family and social history reviewed and updated as indicated. Interim medical history since our last visit reviewed. Allergies and medications reviewed and updated.  Review of Systems  Constitutional: Negative for chills and fever.  Eyes: Negative for visual disturbance.  Respiratory: Negative for chest tightness and shortness of breath.   Cardiovascular: Negative for chest pain and leg swelling.  Genitourinary: Negative for difficulty urinating and dysuria.  Musculoskeletal: Negative for back pain and gait problem.  Skin: Negative for rash.  Neurological: Negative for light-headedness and headaches.  Psychiatric/Behavioral: Negative for agitation and behavioral problems.  All other systems reviewed and are negative.   Per HPI unless specifically indicated above   Allergies as of 03/18/2020   No Known Allergies     Medication List       Accurate as of March 18, 2020  8:56 AM. If you have any questions, ask your nurse or doctor.        aspirin EC 81 MG tablet Take 81 mg by mouth daily.   DAILY MULTI 50+ PO Take by mouth.   Tylenol 8 Hour Arthritis Pain 650 MG CR  tablet Generic drug: acetaminophen Take 650 mg by mouth every 8 (eight) hours as needed for pain.   VITAMIN C GUMMIE PO Take by mouth.        Objective:   BP 139/88   Pulse 82   Temp 98.2 F (36.8 C)   Ht _1  (1.549 m)   Wt 135 lb 6.4 oz (61.4 kg)   SpO2 96%   BMI 25.58 kg/m   Wt Readings from Last 3 Encounters:  03/18/20 135 lb 6.4 oz (61.4 kg)  06/12/19 155 lb 9.6 oz (70.6 kg)  03/31/19 156 lb 3.2 oz (70.9 kg)    Physical Exam Vitals and nursing note reviewed.  Constitutional:      General: She is not in acute distress.    Appearance: She is well-developed. She is not diaphoretic.  Eyes:     Conjunctiva/sclera: Conjunctivae normal.  Cardiovascular:     Rate and Rhythm: Normal rate and regular rhythm.     Heart sounds: Normal heart sounds. No murmur heard.   Pulmonary:     Effort: Pulmonary effort is normal. No respiratory distress.     Breath sounds: Normal breath sounds. No wheezing.  Musculoskeletal:        General: No tenderness. Normal range of motion.  Skin:    General: Skin is warm and dry.     Findings: No rash.  Neurological:     Mental Status: She is alert and oriented  to person, place, and time.     Coordination: Coordination normal.  Psychiatric:        Behavior: Behavior normal.       Assessment & Plan:   Problem List Items Addressed This Visit    None    Visit Diagnoses    Primary hypertension    -  Primary   Diabetes mellitus screening       Relevant Orders   CMP14+EGFR   Lipid screening       Relevant Orders   CBC with Differential/Platelet   CMP14+EGFR   Lipid panel   Need for hepatitis C screening test       Relevant Orders   Hepatitis C antibody   Encounter for screening for HIV       Relevant Orders   HIV Antibody (routine testing w rflx)   Encounter for screening mammogram for malignant neoplasm of breast       Relevant Orders   MM 3D SCREEN BREAST BILATERAL      No change in medication, will continue to monitor  blood pressure at home, running borderline and allowing slightly permissive hypertension. Follow up plan: Return in about 1 year (around 03/18/2021), or if symptoms worsen or fail to improve, for Hypertension and physical.  Counseling provided for all of the vaccine components Orders Placed This Encounter  Procedures  . MM 3D SCREEN BREAST BILATERAL  . CBC with Differential/Platelet  . CMP14+EGFR  . Lipid panel  . Hepatitis C antibody  . HIV Antibody (routine testing w rflx)    Caryl Pina, MD [facility] 03/18/2020, 8:56 AM

## 2020-03-19 LAB — CBC WITH DIFFERENTIAL/PLATELET
Basophils Absolute: 0.1 10*3/uL (ref 0.0–0.2)
Basos: 1 %
EOS (ABSOLUTE): 0.2 10*3/uL (ref 0.0–0.4)
Eos: 5 %
Hematocrit: 41.2 % (ref 34.0–46.6)
Hemoglobin: 14.3 g/dL (ref 11.1–15.9)
Immature Grans (Abs): 0 10*3/uL (ref 0.0–0.1)
Immature Granulocytes: 0 %
Lymphocytes Absolute: 1.4 10*3/uL (ref 0.7–3.1)
Lymphs: 32 %
MCHC: 34.7 g/dL (ref 31.5–35.7)
MCV: 89 fL (ref 79–97)
Monocytes Absolute: 0.3 10*3/uL (ref 0.1–0.9)
Monocytes: 7 %
Neutrophils Absolute: 2.4 10*3/uL (ref 1.4–7.0)
Neutrophils: 55 %
Platelets: 275 10*3/uL (ref 150–450)
RBC: 4.64 x10E6/uL (ref 3.77–5.28)
RDW: 12.5 % (ref 11.7–15.4)
WBC: 4.3 10*3/uL (ref 3.4–10.8)

## 2020-03-19 LAB — LIPID PANEL
Chol/HDL Ratio: 3.8 ratio (ref 0.0–4.4)
Cholesterol, Total: 178 mg/dL (ref 100–199)
HDL: 47 mg/dL (ref >39)
LDL Chol Calc (NIH): 122 mg/dL — ABNORMAL HIGH (ref 0–99)
Triglycerides: 47 mg/dL (ref 0–149)
VLDL Cholesterol Cal: 9 mg/dL (ref 5–40)

## 2020-03-19 LAB — CMP14+EGFR
ALT: 23 IU/L (ref 0–32)
Albumin/Globulin Ratio: 1.6 (ref 1.2–2.2)
Albumin: 4.4 g/dL (ref 3.8–4.8)
Alkaline Phosphatase: 87 IU/L (ref 44–121)
BUN/Creatinine Ratio: 16 (ref 12–28)
BUN: 14 mg/dL (ref 8–27)
Bilirubin Total: 0.3 mg/dL (ref 0.0–1.2)
CO2: 24 mmol/L (ref 20–29)
Chloride: 106 mmol/L (ref 96–106)
Creatinine, Ser: 0.85 mg/dL (ref 0.57–1.00)
GFR calc Af Amer: 84 mL/min/{1.73_m2} (ref >59)
GFR calc non Af Amer: 73 mL/min/{1.73_m2} (ref >59)
Globulin, Total: 2.8 g/dL (ref 1.5–4.5)
Glucose: 105 mg/dL — ABNORMAL HIGH (ref 65–99)
Potassium: 4.7 mmol/L (ref 3.5–5.2)
Sodium: 142 mmol/L (ref 134–144)
Total Protein: 7.2 g/dL (ref 6.0–8.5)

## 2020-03-19 LAB — HIV ANTIBODY (ROUTINE TESTING W REFLEX): HIV Screen 4th Generation wRfx: NONREACTIVE

## 2020-03-19 LAB — HEPATITIS C ANTIBODY: Hep C Virus Ab: <0.1 s/co ratio (ref 0.0–0.9)

## 2020-03-20 ENCOUNTER — Telehealth: Payer: Self-pay

## 2020-03-20 NOTE — Telephone Encounter (Signed)
Patient aware of labs results 

## 2020-04-15 ENCOUNTER — Ambulatory Visit
Admission: RE | Admit: 2020-04-15 | Discharge: 2020-04-15 | Disposition: A | Discharge status: Home / Self Care | Payer: Medicaid Other | Source: Ambulatory Visit | Attending: Family Medicine | Admitting: Family Medicine

## 2020-04-15 ENCOUNTER — Other Ambulatory Visit: Payer: Self-pay

## 2020-04-15 DIAGNOSIS — Z1231 Encounter for screening mammogram for malignant neoplasm of breast: Secondary | ICD-10-CM

## 2020-06-07 DIAGNOSIS — H5213 Myopia, bilateral: Secondary | ICD-10-CM | POA: Diagnosis not present

## 2021-03-19 ENCOUNTER — Encounter: Payer: Self-pay | Admitting: Family Medicine

## 2021-03-19 ENCOUNTER — Other Ambulatory Visit: Payer: Self-pay

## 2021-03-19 ENCOUNTER — Ambulatory Visit: Payer: Medicaid Other | Admitting: Family Medicine

## 2021-03-19 VITALS — BP 137/76 | HR 73 | Ht 61.0 in | Wt 128.0 lb

## 2021-03-19 DIAGNOSIS — Z23 Encounter for immunization: Secondary | ICD-10-CM

## 2021-03-19 DIAGNOSIS — Z1231 Encounter for screening mammogram for malignant neoplasm of breast: Secondary | ICD-10-CM | POA: Diagnosis not present

## 2021-03-19 DIAGNOSIS — I10 Essential (primary) hypertension: Secondary | ICD-10-CM

## 2021-03-19 DIAGNOSIS — Z13220 Encounter for screening for lipoid disorders: Secondary | ICD-10-CM

## 2021-03-19 LAB — CBC WITH DIFFERENTIAL/PLATELET
Basophils Absolute: 0 10*3/uL (ref 0.0–0.2)
Basos: 1 %
EOS (ABSOLUTE): 0.2 10*3/uL (ref 0.0–0.4)
Eos: 6 %
Hematocrit: 40.8 % (ref 34.0–46.6)
Hemoglobin: 13.9 g/dL (ref 11.1–15.9)
Immature Grans (Abs): 0 10*3/uL (ref 0.0–0.1)
Immature Granulocytes: 0 %
Lymphocytes Absolute: 1.3 10*3/uL (ref 0.7–3.1)
Lymphs: 35 %
MCH: 30.7 pg (ref 26.6–33.0)
MCHC: 34.1 g/dL (ref 31.5–35.7)
MCV: 90 fL (ref 79–97)
Monocytes Absolute: 0.3 10*3/uL (ref 0.1–0.9)
Monocytes: 7 %
Neutrophils Absolute: 1.9 10*3/uL (ref 1.4–7.0)
Neutrophils: 51 %
Platelets: 221 10*3/uL (ref 150–450)
RBC: 4.53 x10E6/uL (ref 3.77–5.28)
RDW: 12.1 % (ref 11.7–15.4)
WBC: 3.8 10*3/uL (ref 3.4–10.8)

## 2021-03-19 LAB — CMP14+EGFR
ALT: 19 IU/L (ref 0–32)
AST: 22 IU/L (ref 0–40)
Albumin/Globulin Ratio: 1.7 (ref 1.2–2.2)
Albumin: 4.5 g/dL (ref 3.8–4.8)
Alkaline Phosphatase: 77 IU/L (ref 44–121)
BUN/Creatinine Ratio: 16 (ref 12–28)
BUN: 13 mg/dL (ref 8–27)
Bilirubin Total: 0.5 mg/dL (ref 0.0–1.2)
CO2: 26 mmol/L (ref 20–29)
Calcium: 9.4 mg/dL (ref 8.7–10.3)
Chloride: 106 mmol/L (ref 96–106)
Creatinine, Ser: 0.82 mg/dL (ref 0.57–1.00)
Globulin, Total: 2.6 g/dL (ref 1.5–4.5)
Glucose: 101 mg/dL — ABNORMAL HIGH (ref 70–99)
Potassium: 4.1 mmol/L (ref 3.5–5.2)
Sodium: 144 mmol/L (ref 134–144)
Total Protein: 7.1 g/dL (ref 6.0–8.5)
eGFR: 80 mL/min/{1.73_m2} (ref >59)

## 2021-03-19 LAB — LIPID PANEL
Chol/HDL Ratio: 2.9 ratio (ref 0.0–4.4)
Cholesterol, Total: 181 mg/dL (ref 100–199)
HDL: 63 mg/dL (ref >39)
LDL Chol Calc (NIH): 105 mg/dL — ABNORMAL HIGH (ref 0–99)
Triglycerides: 68 mg/dL (ref 0–149)
VLDL Cholesterol Cal: 13 mg/dL (ref 5–40)

## 2021-03-19 NOTE — Progress Notes (Signed)
BP 137/76   Pulse 73   Ht 5' 1"  (1.549 m)   Wt 128 lb (58.1 kg)   SpO2 99%   BMI 24.19 kg/m    Subjective:   Patient ID: Gwendolyn Ortiz, female    DOB: Dec 26, 1955, 65 y.o.   MRN: [id number]  HPI: Gwendolyn Ortiz is a 65 y.o. female presenting on 03/19/2021 for Medical Management of Chronic Issues and Hypertension   HPI Hypertension Patient is currently on no medication currently, has been diet controlled, and their blood pressure today is 137/76. Patient denies any lightheadedness or dizziness. Patient denies headaches, blurred vision, chest pains, shortness of breath, or weakness. Denies any side effects from medication and is content with current medication.   Discussed other health maintenance including Pap smear and mammograms, she was scheduled for the mammogram and she is going to decide on the Pap smear if she wants to do it.  Relevant past medical, surgical, family and social history reviewed and updated as indicated. Interim medical history since our last visit reviewed. Allergies and medications reviewed and updated.  Review of Systems  Constitutional:  Negative for chills and fever.  HENT:  Positive for congestion and ear pain. Negative for ear discharge.   Eyes:  Negative for visual disturbance.  Respiratory:  Negative for chest tightness and shortness of breath.   Cardiovascular:  Negative for chest pain and leg swelling.  Genitourinary:  Negative for difficulty urinating and dysuria.  Musculoskeletal:  Negative for back pain and gait problem.  Skin:  Negative for rash.  Neurological:  Negative for light-headedness and headaches.  Psychiatric/Behavioral:  Negative for agitation and behavioral problems.   All other systems reviewed and are negative.  Per HPI unless specifically indicated above   Allergies as of 03/19/2021   No Known Allergies      Medication List        Accurate as of March 19, 2021 10:06 AM. If you have any questions, ask your nurse or  doctor.          acetaminophen 650 MG CR tablet Commonly known as: TYLENOL Take 650 mg by mouth every 8 (eight) hours as needed for pain.   aspirin EC 81 MG tablet Take 81 mg by mouth daily.   DAILY MULTI 50+ PO Take by mouth.   VITAMIN C GUMMIE PO Take by mouth.         Objective:   BP 137/76   Pulse 73   Ht 5' 1"  (1.549 m)   Wt 128 lb (58.1 kg)   SpO2 99%   BMI 24.19 kg/m   Wt Readings from Last 3 Encounters:  03/19/21 128 lb (58.1 kg)  03/18/20 135 lb 6.4 oz (61.4 kg)  06/12/19 155 lb 9.6 oz (70.6 kg)    Physical Exam Vitals and nursing note reviewed.  Constitutional:      General: She is not in acute distress.    Appearance: She is well-developed. She is not diaphoretic.  Eyes:     Conjunctiva/sclera: Conjunctivae normal.  Cardiovascular:     Rate and Rhythm: Normal rate and regular rhythm.     Heart sounds: Normal heart sounds. No murmur heard. Pulmonary:     Effort: Pulmonary effort is normal. No respiratory distress.     Breath sounds: Normal breath sounds. No wheezing.  Musculoskeletal:        General: No swelling or tenderness. Normal range of motion.  Skin:    General: Skin is warm and dry.  Findings: No rash.  Neurological:     Mental Status: She is alert and oriented to person, place, and time.     Coordination: Coordination normal.  Psychiatric:        Behavior: Behavior normal.      Assessment & Plan:   Problem List Items Addressed This Visit   None Visit Diagnoses     Primary hypertension    -  Primary   Relevant Orders   CBC with Differential/Platelet   CMP14+EGFR   Lipid panel   Lipid screening       Relevant Orders   CBC with Differential/Platelet   CMP14+EGFR   Lipid panel   Encounter for screening mammogram for malignant neoplasm of breast       Relevant Orders   MM 3D SCREEN BREAST BILATERAL   Need for immunization against influenza       Relevant Orders   Flu Vaccine QUAD 63moIM (Fluarix, Fluzone & Alfiuria  Quad PF) (Completed)       Patient has a little bit of ear pressure, recommended Flonase over-the-counter for it and it does not improve and we can discuss other options in the future.  Follow up plan: Return in about 1 year (around 03/19/2022), or if symptoms worsen or fail to improve, for physical and htn.  Counseling provided for all of the vaccine components Orders Placed This Encounter  Procedures   MM 3D SCREEN BREAST BILATERAL   Flu Vaccine QUAD 623moM (Fluarix, Fluzone & Alfiuria Quad PF)   CBC with Differential/Platelet   CMP14+EGFR   Lipid panel    JoCaryl PinaMD [facility] 03/19/2021, 10:06 AM

## 2021-04-21 ENCOUNTER — Other Ambulatory Visit (HOSPITAL_COMMUNITY)
Admission: RE | Admit: 2021-04-21 | Discharge: 2021-04-21 | Disposition: A | Discharge status: Home / Self Care | Payer: Medicaid Other | Source: Ambulatory Visit | Attending: Family Medicine | Admitting: Family Medicine

## 2021-04-21 ENCOUNTER — Ambulatory Visit (INDEPENDENT_AMBULATORY_CARE_PROVIDER_SITE_OTHER): Payer: Medicaid Other | Admitting: Family Medicine

## 2021-04-21 ENCOUNTER — Encounter: Payer: Self-pay | Admitting: Family Medicine

## 2021-04-21 VITALS — BP 136/84 | HR 91 | Ht 61.0 in | Wt 124.0 lb

## 2021-04-21 DIAGNOSIS — Z01419 Encounter for gynecological examination (general) (routine) without abnormal findings: Secondary | ICD-10-CM

## 2021-04-21 NOTE — Progress Notes (Signed)
BP 136/84   Pulse 91   Ht 5\' 1"  (1.549 m)   Wt 124 lb (56.2 kg)   SpO2 97%   BMI 23.43 kg/m    Subjective:   Patient ID: Gwendolyn Ortiz, female    DOB: 10-05-1955, 65 y.o.   MRN: [id number]  HPI: [name] is a 65 y.o. female presenting on 04/21/2021 for Medical Management of Chronic Issues (pap)   HPI Physical exam and Pap smear Patient denies any chest pain, shortness of breath, headaches or vision issues, abdominal complaints, diarrhea, nausea, vomiting, or joint issues.  Patient denies any vaginal issues.  She has had sexual partners before but has never been in a steady relationship and they have only been intermittent and she has not had any intercourse for over a year.  Relevant past medical, surgical, family and social history reviewed and updated as indicated. Interim medical history since our last visit reviewed. Allergies and medications reviewed and updated.  Review of Systems  Constitutional:  Negative for chills and fever.  HENT:  Negative for congestion, ear discharge and ear pain.   Eyes:  Negative for visual disturbance.  Respiratory:  Negative for chest tightness and shortness of breath.   Cardiovascular:  Negative for chest pain and leg swelling.  Genitourinary:  Negative for difficulty urinating, dysuria, vaginal bleeding, vaginal discharge and vaginal pain.  Musculoskeletal:  Negative for back pain and gait problem.  Skin:  Negative for rash.  Neurological:  Negative for dizziness, light-headedness and headaches.  Psychiatric/Behavioral:  Negative for agitation and behavioral problems.   All other systems reviewed and are negative.  Per HPI unless specifically indicated above   Allergies as of 04/21/2021   No Known Allergies      Medication List        Accurate as of April 21, 2021  3:00 PM. If you have any questions, ask your nurse or doctor.          acetaminophen 650 MG CR tablet Commonly known as: TYLENOL Take 650 mg by mouth  every 8 (eight) hours as needed for pain.   aspirin EC 81 MG tablet Take 81 mg by mouth daily.   DAILY MULTI 50+ PO Take by mouth.   VITAMIN C GUMMIE PO Take by mouth.         Objective:   BP 136/84   Pulse 91   Ht 5\' 1"  (1.549 m)   Wt 124 lb (56.2 kg)   SpO2 97%   BMI 23.43 kg/m   Wt Readings from Last 3 Encounters:  04/21/21 124 lb (56.2 kg)  03/19/21 128 lb (58.1 kg)  03/18/20 135 lb 6.4 oz (61.4 kg)    Physical Exam Vitals and nursing note reviewed. Exam conducted with a chaperone present.  Constitutional:      General: She is not in acute distress.    Appearance: She is well-developed. She is not diaphoretic.  Eyes:     Conjunctiva/sclera: Conjunctivae normal.     Pupils: Pupils are equal, round, and reactive to light.  Neck:     Thyroid: No thyromegaly.  Cardiovascular:     Rate and Rhythm: Normal rate and regular rhythm.     Heart sounds: Normal heart sounds. No murmur heard. Pulmonary:     Effort: Pulmonary effort is normal. No respiratory distress.     Breath sounds: Normal breath sounds. No wheezing.  Abdominal:     General: Bowel sounds are normal. There is no distension.     Palpations:  Abdomen is soft.     Tenderness: There is no abdominal tenderness. There is no guarding or rebound.  Genitourinary:    Exam position: Supine.     Labia:        Right: No rash or lesion.        Left: No rash or lesion.      Vagina: Normal.     Cervix: No cervical motion tenderness, discharge or friability.     Uterus: Not deviated, not enlarged, not fixed and not tender.      Adnexa:        Right: No mass or tenderness.         Left: No mass or tenderness.    Musculoskeletal:        General: Normal range of motion.     Cervical back: Neck supple.  Lymphadenopathy:     Cervical: No cervical adenopathy.  Skin:    General: Skin is warm and dry.     Findings: No rash.  Neurological:     Mental Status: She is alert and oriented to person, place, and time.      Coordination: Coordination normal.  Psychiatric:        Behavior: Behavior normal.      Assessment & Plan:   Problem List Items Addressed This Visit   None Visit Diagnoses     Well woman exam with routine gynecological exam    -  Primary   Relevant Orders   Cytology - PAP([place])     Patient has mammogram next week  Follow up plan: Return in about 1 year (around 04/21/2022), or if symptoms worsen or fail to improve, for physical.  Counseling provided for all of the vaccine components No orders of the defined types were placed in this encounter.   Arville Care, MD [facility] 04/21/2021, 3:00 PM

## 2021-05-02 LAB — CYTOLOGY - PAP
Comment: NEGATIVE
Diagnosis: NEGATIVE
High risk HPV: NEGATIVE

## 2021-05-14 ENCOUNTER — Ambulatory Visit
Admission: RE | Admit: 2021-05-14 | Discharge: 2021-05-14 | Disposition: A | Discharge status: Home / Self Care | Payer: Medicaid Other | Source: Ambulatory Visit | Attending: Family Medicine | Admitting: Family Medicine

## 2021-05-14 DIAGNOSIS — Z1231 Encounter for screening mammogram for malignant neoplasm of breast: Secondary | ICD-10-CM | POA: Diagnosis not present

## 2021-09-29 ENCOUNTER — Encounter (HOSPITAL_COMMUNITY): Payer: Self-pay

## 2021-09-29 ENCOUNTER — Emergency Department (HOSPITAL_COMMUNITY)
Admission: EM | Admit: 2021-09-29 | Discharge: 2021-09-30 | Disposition: A | Discharge status: Home / Self Care | Payer: Medicaid Other | Attending: Emergency Medicine | Admitting: Emergency Medicine

## 2021-09-29 ENCOUNTER — Emergency Department (HOSPITAL_COMMUNITY): Payer: Medicaid Other

## 2021-09-29 ENCOUNTER — Encounter: Payer: Self-pay | Admitting: *Deleted

## 2021-09-29 ENCOUNTER — Other Ambulatory Visit: Payer: Self-pay

## 2021-09-29 DIAGNOSIS — R200 Anesthesia of skin: Secondary | ICD-10-CM | POA: Diagnosis not present

## 2021-09-29 DIAGNOSIS — R202 Paresthesia of skin: Secondary | ICD-10-CM | POA: Insufficient documentation

## 2021-09-29 DIAGNOSIS — Z7982 Long term (current) use of aspirin: Secondary | ICD-10-CM | POA: Diagnosis not present

## 2021-09-29 DIAGNOSIS — Z20822 Contact with and (suspected) exposure to covid-19: Secondary | ICD-10-CM | POA: Diagnosis not present

## 2021-09-29 DIAGNOSIS — R29818 Other symptoms and signs involving the nervous system: Secondary | ICD-10-CM | POA: Diagnosis not present

## 2021-09-29 DIAGNOSIS — G9389 Other specified disorders of brain: Secondary | ICD-10-CM | POA: Diagnosis not present

## 2021-09-29 DIAGNOSIS — R9431 Abnormal electrocardiogram [ECG] [EKG]: Secondary | ICD-10-CM | POA: Diagnosis not present

## 2021-09-29 LAB — DIFFERENTIAL
Abs Immature Granulocytes: 0.01 10*3/uL (ref 0.00–0.07)
Basophils Absolute: 0.1 10*3/uL (ref 0.0–0.1)
Basophils Relative: 1 %
Eosinophils Absolute: 0.1 10*3/uL (ref 0.0–0.5)
Eosinophils Relative: 2 %
Immature Granulocytes: 0 %
Lymphocytes Relative: 25 %
Lymphs Abs: 1.3 10*3/uL (ref 0.7–4.0)
Monocytes Absolute: 0.4 10*3/uL (ref 0.1–1.0)
Monocytes Relative: 8 %
Neutro Abs: 3.4 10*3/uL (ref 1.7–7.7)
Neutrophils Relative %: 64 %

## 2021-09-29 LAB — RAPID URINE DRUG SCREEN, HOSP PERFORMED
Amphetamines: NOT DETECTED
Barbiturates: NOT DETECTED
Benzodiazepines: NOT DETECTED
Cocaine: NOT DETECTED
Opiates: NOT DETECTED
Tetrahydrocannabinol: NOT DETECTED

## 2021-09-29 LAB — URINALYSIS, ROUTINE W REFLEX MICROSCOPIC
Bilirubin Urine: NEGATIVE
Glucose, UA: NEGATIVE mg/dL
Hgb urine dipstick: NEGATIVE
Ketones, ur: NEGATIVE mg/dL
Leukocytes,Ua: NEGATIVE
Nitrite: NEGATIVE
Protein, ur: NEGATIVE mg/dL
Specific Gravity, Urine: 1.01 (ref 1.005–1.030)
pH: 6 (ref 5.0–8.0)

## 2021-09-29 LAB — COMPREHENSIVE METABOLIC PANEL
ALT: 22 U/L (ref 0–44)
AST: 23 U/L (ref 15–41)
Albumin: 3.9 g/dL (ref 3.5–5.0)
Alkaline Phosphatase: 55 U/L (ref 38–126)
Anion gap: 6 (ref 5–15)
BUN: 14 mg/dL (ref 8–23)
CO2: 25 mmol/L (ref 22–32)
Calcium: 9.1 mg/dL (ref 8.9–10.3)
Chloride: 109 mmol/L (ref 98–111)
Creatinine, Ser: 0.75 mg/dL (ref 0.44–1.00)
GFR, Estimated: >60 mL/min (ref >60)
Glucose, Bld: 99 mg/dL (ref 70–99)
Potassium: 3.9 mmol/L (ref 3.5–5.1)
Sodium: 140 mmol/L (ref 135–145)
Total Bilirubin: 0.7 mg/dL (ref 0.3–1.2)
Total Protein: 6.7 g/dL (ref 6.5–8.1)

## 2021-09-29 LAB — I-STAT CHEM 8, ED
BUN: 16 mg/dL (ref 8–23)
Calcium, Ion: 1.23 mmol/L (ref 1.15–1.40)
Chloride: 105 mmol/L (ref 98–111)
Creatinine, Ser: 0.7 mg/dL (ref 0.44–1.00)
Glucose, Bld: 100 mg/dL — ABNORMAL HIGH (ref 70–99)
HCT: 38 % (ref 36.0–46.0)
Hemoglobin: 12.9 g/dL (ref 12.0–15.0)
Potassium: 3.9 mmol/L (ref 3.5–5.1)
Sodium: 141 mmol/L (ref 135–145)
TCO2: 26 mmol/L (ref 22–32)

## 2021-09-29 LAB — CBC
HCT: 40.3 % (ref 36.0–46.0)
Hemoglobin: 13.3 g/dL (ref 12.0–15.0)
MCH: 31 pg (ref 26.0–34.0)
MCHC: 33 g/dL (ref 30.0–36.0)
MCV: 93.9 fL (ref 80.0–100.0)
Platelets: 228 10*3/uL (ref 150–400)
RBC: 4.29 MIL/uL (ref 3.87–5.11)
RDW: 12.9 % (ref 11.5–15.5)
WBC: 5.3 10*3/uL (ref 4.0–10.5)
nRBC: 0 % (ref 0.0–0.2)

## 2021-09-29 LAB — PROTIME-INR
INR: 1 (ref 0.8–1.2)
Prothrombin Time: 12.5 seconds (ref 11.4–15.2)

## 2021-09-29 LAB — RESP PANEL BY RT-PCR (FLU A&B, COVID) ARPGX2
Influenza A by PCR: NEGATIVE
Influenza B by PCR: NEGATIVE
SARS Coronavirus 2 by RT PCR: NEGATIVE

## 2021-09-29 LAB — ETHANOL: Alcohol, Ethyl (B): <10 mg/dL (ref <10)

## 2021-09-29 LAB — APTT: aPTT: 29 seconds (ref 24–36)

## 2021-09-29 NOTE — ED Provider Notes (Addendum)
?[facility] EMERGENCY DEPARTMENT ?Provider Note ? ? ?CSN: [id number] ?Arrival date & time: 09/29/21  1450 ? ?  ? ?History ? ?Chief Complaint  ?Patient presents with  ? Numbness  ? ? ?Gwendolyn Ortiz is a 66 y.o. female. ? ?66 year old female with prior medical history as detailed below presents for evaluation.  Patient reports that sometime around 9 or 10 AM this morning she developed numbness of the entire left side of her body.  Patient reports that the left face, left thorax, and left arm, and left leg are "numb and tingling." ? ?She denies weakness to any extremities. ? ?She is able to ambulate without difficulty. ? ?She does have a self reported history of stroke - this was reportedly diagnosed in 2010.  Patient without sequela or issues after her prior reported stroke. ? ? ?The following was excerpted from neurosurgical clinic notes (07/10/2019) in the patient's Epic chart review.  She is known to Dr. Tempie Donning with neurosurgery at [facility]. ? "Ms. 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 She is s/p Gamma Knife radiosurgery on Oct 16, 2014 with a positive radiation treatment effect on recent follow-up imaging with decrease in AVM size. DSA on 06/02/19 showed normal cerebral angiogram without evidence of residual AVM or early venous drainage. Today she is called to discuss results and NSU recommendation. States she has noticed decreased vision in R eye, which she has not seen eye doctor. No headaches or other symptoms. " ? ? ? ?The history is provided by the patient and medical records.  ?Illness ?Location:  "Numbness to left side of body" ?Severity:  Mild ?Onset quality:  Sudden ?Duration:  9 hours ?Timing:  Constant ?Progression:  Unchanged ?Chronicity:  New ? ?  ? ?Home Medications ?Prior to Admission medications   ?Medication Sig Start Date End Date  Taking? Authorizing Provider  ?acetaminophen (TYLENOL) 650 MG CR tablet Take 650 mg by mouth every 8 (eight) hours as needed for pain.    [provider]  ?Ascorbic Acid (VITAMIN C GUMMIE PO) Take by mouth.    [provider]  ?aspirin EC 81 MG tablet Take 81 mg by mouth daily.    [provider]  ?Multiple Vitamins-Minerals (DAILY MULTI 50+ PO) Take by mouth.    [provider]  ?   ? ?Allergies    ?Patient has no known allergies.   ? ?Review of Systems   ?Review of Systems  ?All other systems reviewed and are negative. ? ?Physical Exam ?Updated Vital Signs ?BP 129/82   Pulse 70   Temp 98.2 ?F (36.8 ?C)   Resp 17   Ht 5\' 3"  (1.6 m)   Wt 61.2 kg   SpO2 100%   BMI 23.91 kg/m?  ?Physical Exam ?Vitals and nursing note reviewed.  ?Constitutional:   ?   General: She is not in acute distress. ?   Appearance: Normal appearance. She is well-developed.  ?HENT:  ?   Head: Normocephalic and atraumatic.  ?Eyes:  ?   Conjunctiva/sclera: Conjunctivae normal.  ?   Pupils: Pupils are equal, round, and reactive to light.  ?Cardiovascular:  ?   Rate and Rhythm: Normal rate and regular rhythm.  ?   Heart sounds: Normal heart sounds.  ?Pulmonary:  ?   Effort: Pulmonary effort is normal. No respiratory distress.  ?  Breath sounds: Normal breath sounds.  ?Abdominal:  ?   General: There is no distension.  ?   Palpations: Abdomen is soft.  ?   Tenderness: There is no abdominal tenderness.  ?Musculoskeletal:     ?   General: No deformity. Normal range of motion.  ?   Cervical back: Normal range of motion and neck supple.  ?Skin: ?   General: Skin is warm and dry.  ?Neurological:  ?   General: No focal deficit present.  ?   Mental Status: She is alert and oriented to person, place, and time. Mental status is at baseline.  ?   Cranial Nerves: No cranial nerve deficit.  ?   Motor: No weakness.  ?   Coordination: Coordination normal.  ?   Gait: Gait normal.  ? ? ?ED Results / Procedures / Treatments    ?Labs ?(all labs ordered are listed, but only abnormal results are displayed) ?Labs Reviewed  ?I-STAT CHEM 8, ED - Abnormal; Notable for the following components:  ?    Result Value  ? Glucose, Bld 100 (*)   ? All other components within normal limits  ?RESP PANEL BY RT-PCR (FLU A&B, COVID) ARPGX2  ?ETHANOL  ?PROTIME-INR  ?APTT  ?CBC  ?DIFFERENTIAL  ?COMPREHENSIVE METABOLIC PANEL  ?RAPID URINE DRUG SCREEN, HOSP PERFORMED  ?URINALYSIS, ROUTINE W REFLEX MICROSCOPIC  ? ? ?EKG ?EKG Interpretation ? ?Date/Time:  Monday Sep 29 2021 18:59:30 EDT ?Ventricular Rate:  72 ?PR Interval:  164 ?QRS Duration: 81 ?QT Interval:  408 ?QTC Calculation: 447 ?R Axis:   50 ?Text Interpretation: Sinus rhythm Confirmed by Kristine RoyalMessick, Nyeisha Goodall [phone]([id number]) on 09/29/2021 7:01:40 PM ? ?Radiology ?CT HEAD WO CONTRAST ? ?Result Date: 09/29/2021 ?CLINICAL DATA:  Neuro deficit, acute, stroke suspected EXAM: CT HEAD WITHOUT CONTRAST TECHNIQUE: Contiguous axial images were obtained from the base of the skull through the vertex without intravenous contrast. RADIATION DOSE REDUCTION: This exam was performed according to the departmental dose-optimization program which includes automated exposure control, adjustment of the mA and/or kV according to patient size and/or use of iterative reconstruction technique. COMPARISON:  CT head Oct 09, 2008. FINDINGS: Brain: In comparison to CT head from Oct 09, 2008, increase in conspicuity of abnormality along the parahippocampal medial right temporal lobe. Area of hyperdensity in this region may represent hyperdense tumor or hemorrhagic component. No evidence of acute large vascular territory infarct, midline shift, hydrocephalus, or basal cistern effacement. Vascular: No hyperdense vessel identified. Skull: No acute fracture. Sinuses/Orbits: Clear sinuses.  No acute orbital findings. Other: No mastoid effusions IMPRESSION: In comparison to CT head from Oct 09, 2008, increase in conspicuity of abnormality along the  parahippocampal medial right temporal lobe. Area of hyperdensity in this region may represent hyperdense, mineralized, and/or hemorrhagic component. Recommend MRI with contrast to further evaluate. These results will be called to the ordering clinician or representative by the Radiologist Assistant, and communication documented in the PACS or [employer] Dashboard. Electronically Signed   By: Feliberto HartsFrederick S Jones M.D.   On: 09/29/2021 17:59   ? ?Procedures ?Procedures  ? ? ?Medications Ordered in ED ?Medications - No data to display ? ?ED Course/ Medical Decision Making/ A&P ?  ?                        ?Medical Decision Making ?Amount and/or Complexity of Data Reviewed ?Radiology: ordered. ? ? ? ?Medical Screen Complete ? ?This patient presented to the ED with complaint of numbness/paresthesia. ? ?This  complaint involves an extensive number of treatment options. The initial differential diagnosis includes, but is not limited to, CVA, intracranial pathology, etc. ? ?This presentation is: Acute, Chronic, Self-Limited, Previously Undiagnosed, Uncertain Prognosis, Complicated, Systemic Symptoms, and Threat to Life/Bodily Function ? ?Patient is presenting with complaint of numbness and tingling paresthesias to the left side of her body. ? ?Patient with known history of right thalamic AVM followed by neurosurgery at [facility]. ? ?Patient is without significant focal neurologic abnormality on exam. ? ?CT imaging shows abnormality in the medial right temporal lobe. ? ?MRI with and without contrast pending. ? ?Dr. Eudelia Bunch aware of pending MRI and final disposition. ? ?Co morbidities that complicated the patient's evaluation ? ?History of cerebral AVM ? ? ?Additional history obtained: ? ?Additional history obtained from Family ?External records from outside sources obtained and reviewed including prior ED visits and prior Inpatient records.  ? ? ?Lab Tests: ? ?I ordered and personally interpreted labs.  The pertinent results include:  CBC, CMP, ethanol, INR, COVID, flu ? ? ?Imaging Studies ordered: ? ?I ordered imaging studies including CT head, MRI brain ? ?I agree with the radiologist interpretation. ? ? ?Cardiac Monitoring: ? ?The patient was main

## 2021-09-29 NOTE — Progress Notes (Signed)
Pt walked in w/ having woken up this morning about 9am w/ left sided numbness and dizziness. No slurred speech. Waited to see if it would get any better but it has not so she came into the clinic. After talking to her PCP about sxs and history of having had an MRI to r/o a stroke back in 2010, we offered her an appt at 255 pm but also let pt know that she would be having her go to the hospital to have an MRI to r/o a stroke, so it was their option to be seen then go out or to go on to the ED. Pt and family member choose to go on to [facility] ED to be evaluated. ?

## 2021-09-29 NOTE — Progress Notes (Signed)
I concur, I think she would be better evaluated with an MRI.  We will follow-up after ?

## 2021-09-29 NOTE — ED Provider Triage Note (Signed)
Emergency Medicine Provider Triage Evaluation Note ? ?Gwendolyn Ortiz , a 66 y.o. female  was evaluated in triage.  Pt complains of numbness to the left side of her body.  Symptoms began at 9 AM when she woke up this morning.  Went to sleep last night in her usual state of health.  States that she is having numbness on the left side of her face, as well as the rest of her body.  No history of similar symptoms in the past.  Reports dizziness.  No chest pain, headache.  No injury or trauma recently. ? ?Review of Systems  ?Positive: Numbness to left side of body and face ?Negative: Chest pain ? ?Physical Exam  ?There were no vitals taken for this visit. ?Gen:   Awake, no distress   ?Resp:  Normal effort  ?MSK:   Moves extremities without difficulty  ?Other:  Strength 5/5 in bilateral upper and lower extremities.  Reports decreased sensation to left face, left upper and lower extremity. ? ?Medical Decision Making  ?Medically screening exam initiated at 3:22 PM.  Appropriate orders placed.  Gwendolyn Ortiz was informed that the remainder of the evaluation will be completed by another provider, this initial triage assessment does not replace that evaluation, and the importance of remaining in the ED until their evaluation is complete. ? ?No indication for code stroke due to timing of symptoms but will initiate work-up ?  ?Gwendolyn Pates, PA-C ?09/29/21 2117 ? ?

## 2021-09-29 NOTE — ED Triage Notes (Signed)
Pt arrived POV c/o left sided numbness from her face to her feet that started around 10am this morning. Pt's LKW was 9am this morning. Pt has no other neuro deficits at this time. Pt does have a hx of strokes.  ?

## 2021-09-30 ENCOUNTER — Emergency Department (HOSPITAL_COMMUNITY): Payer: Medicaid Other

## 2021-09-30 DIAGNOSIS — G9389 Other specified disorders of brain: Secondary | ICD-10-CM | POA: Diagnosis not present

## 2021-09-30 DIAGNOSIS — R200 Anesthesia of skin: Secondary | ICD-10-CM | POA: Diagnosis not present

## 2021-09-30 MED ORDER — GADOBUTROL 1 MMOL/ML IV SOLN
6.0000 mL | Freq: Once | INTRAVENOUS | Status: AC | PRN
  Administered 2021-09-30: 6 mL via INTRAVENOUS

## 2021-09-30 NOTE — ED Notes (Signed)
Patient verbalizes understanding of discharge instructions. Opportunity for questioning and answers were provided. Pt discharged from ED. 

## 2021-09-30 NOTE — ED Notes (Signed)
Pt ret from MRI

## 2021-09-30 NOTE — ED Notes (Signed)
Pt transported to MRI 

## 2021-09-30 NOTE — ED Provider Notes (Signed)
I assumed care of this patient.  Please see previous provider note for further details of Hx, PE.  Briefly patient is a 66 y.o. female who presented left-sided paresthesias.  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  CTA was negative.  Pending MRI to rule out stroke. ? ?MRI negative for stroke.  Noted 11 x 11 mm right thalamic malformation.  Increased from prior MRI in 2010 however on record review patient had an MRI in 2018 mentioning that the measurements of the malformation was 14 mm x 14 mm. ? ?On reevaluation patient's symptoms had completely resolved. ? ?Patient is appropriate to follow-up outpatient. ? ?The patient appears reasonably screened and/or stabilized for discharge and I doubt any other medical condition or other [facility] requiring further screening, evaluation, or treatment in the ED at this time prior to discharge. Safe for discharge with strict return precautions. ? ?Disposition: Discharge ? ?Condition: Good ? ?I have discussed the results, Dx and Tx plan with the patient/family who expressed understanding and agree(s) with the plan. Discharge instructions discussed at length. The patient/family was given strict return precautions who verbalized understanding of the instructions. No further questions at time of discharge.  ? ? ?ED Discharge Orders   ? ? None  ? ?  ? ? ? ?Follow Up: ?Dettinger, Elige Radon, MD ?[address] [place] [address] ?[phone] ? ?Call  ?to schedule an appointment for close follow up ? ?Neurosurgery ? ?Call  ?as needed ? ? ? ? ? ? ?  ?Nira Conn, MD ?09/30/21 [phone] ? ?

## 2021-10-01 ENCOUNTER — Ambulatory Visit: Payer: Medicaid Other | Admitting: Family Medicine

## 2021-10-01 ENCOUNTER — Encounter: Payer: Self-pay | Admitting: Family Medicine

## 2021-10-01 VITALS — BP 133/67 | HR 73 | Ht 63.0 in | Wt 123.0 lb

## 2021-10-01 DIAGNOSIS — R531 Weakness: Secondary | ICD-10-CM | POA: Diagnosis not present

## 2021-10-01 DIAGNOSIS — Q2730 Arteriovenous malformation, site unspecified: Secondary | ICD-10-CM | POA: Diagnosis not present

## 2021-10-01 DIAGNOSIS — R202 Paresthesia of skin: Secondary | ICD-10-CM | POA: Diagnosis not present

## 2021-10-01 MED ORDER — PREDNISONE 20 MG PO TABS
ORAL_TABLET | ORAL | 0 refills | Status: DC
Start: 2021-10-01 — End: 2022-03-13

## 2021-10-01 NOTE — Progress Notes (Signed)
? ?BP 133/67   Pulse 73   Ht 5\' 3"  (1.6 m)   Wt 123 lb (55.8 kg)   SpO2 97%   BMI 21.79 kg/m?   ? ?Subjective:  ? ?Patient ID: Gwendolyn Ortiz, female    DOB: 03-04-1956, 66 y.o.   MRN: [id number] ? ?HPI: ?Gwendolyn Ortiz is a 66 y.o. female presenting on 10/01/2021 for ER follow up (Numbness right side of body) ? ? ?HPI ?Patient is coming in today with complaints of left-sided paresthesia and left-sided weakness on her arm and her leg on that side and then some paresthesia on the left side of her face.  This all arose 2 days ago and she awoke with this ?She has been sleeping on that side so initially she tried to see if it would go away but it did not get better.  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  Today in the office she still having the left-sided paresthesias and weakness of both her arm and her leg and some paresthesia on the left side of her face.  She is coming in in a wheelchair because she is still off balance from it that she feels like she cannot walk consistently and this is not seeming to improve over the past couple days. ? ?Relevant past medical, surgical, family and social history reviewed and updated as indicated. Interim medical history since our last visit reviewed. ?Allergies and medications reviewed and updated. ? ?Review of Systems  ?Constitutional:  Negative for chills and fever.  ?Eyes:  Negative for visual disturbance.  ?Respiratory:  Negative for chest tightness and shortness of breath.   ?Cardiovascular:  Negative for chest pain and leg swelling.  ?Musculoskeletal:  Negative for arthralgias, back pain and gait problem.  ?Skin:  Negative for rash.  ?Neurological:  Positive for dizziness, weakness and numbness.  Negative for light-headedness and headaches.  ?Psychiatric/Behavioral:  Negative for agitation and behavioral problems.   ?All other systems reviewed and are negative. ? ?Per HPI unless specifically indicated above ? ? ?Allergies as of 10/01/2021   ?No Known Allergies ?  ? ?  ?Medication List  ?  ? ?  ? Accurate as of Oct 01, 2021  2:19 PM. If you have any questions, ask your nurse or doctor.  ?  ?  ? ?  ? ?DAILY MULTI 50+ PO ?Take 1 tablet by mouth daily. ?  ?predniSONE 20 MG tablet ?Commonly known as: DELTASONE ?2 po at same time daily for 5 days ?Started by: Worthy Rancher, MD ?  ? ?  ? ? ? ?Objective:  ? ?BP 133/67   Pulse 73   Ht 5\' 3"  (1.6 m)   Wt 123 lb (55.8 kg)   SpO2 97%   BMI 21.79 kg/m?   ?Wt Readings from Last 3 Encounters:  ?10/01/21 123 lb (55.8 kg)  ?09/29/21 135 lb (61.2 kg)  ?04/21/21 124 lb (56.2 kg)  ?  ?Physical Exam ?Vitals and nursing note reviewed.  ?Constitutional:   ?   General: She is not in acute distress. ?   Appearance: She is well-developed. She is not diaphoretic.  ?Eyes:  ?   Conjunctiva/sclera: Conjunctivae normal.  ?  Pupils: Pupils are equal, round, and reactive to light.  ?Cardiovascular:  ?   Rate and Rhythm: Normal rate and regular rhythm.  ?   Heart sounds: Normal heart sounds. No murmur heard. ?Pulmonary:  ?   Effort: Pulmonary effort is normal. No respiratory distress.  ?   Breath sounds: Normal breath sounds. No wheezing.  ?Musculoskeletal:     ?   General: No tenderness. Normal range of motion.  ?Skin: ?   General: Skin is warm and dry.  ?   Findings: No rash.  ?Neurological:  ?   Mental Status: She is alert and oriented to person, place, and time.  ?   Cranial Nerves: Cranial nerves 2-12 are intact. No cranial nerve deficit.  ?   Sensory: Sensory deficit (Left-sided deficit) present.  ?   Motor: Weakness (Left-sided 4 out of 5 strength in both arm and leg) present. No tremor, atrophy or abnormal muscle tone.  ?   Coordination: Coordination normal.  ?   Comments:  Patient is very unsteady on her gait because of the numbness and weakness on the left side.  ?Psychiatric:     ?   Behavior: Behavior normal.  ? ? ? ? ?Assessment & Plan:  ? ?Problem List Items Addressed This Visit   ?None ?Visit Diagnoses   ? ? AVM (arteriovenous malformation)    -  Primary  ? Relevant Medications  ? predniSONE (DELTASONE) 20 MG tablet  ? Other Relevant Orders  ? Ambulatory referral to Neurosurgery  ? Acute left-sided weakness      ? Relevant Medications  ? predniSONE (DELTASONE) 20 MG tablet  ? Other Relevant Orders  ? Ambulatory referral to Neurosurgery  ? Paresthesia      ? Relevant Orders  ? Ambulatory referral to Neurosurgery  ? ?  ?Placed referral to neurosurgery, will do short course of prednisone to see if we can calm down any inflammation that could be compressing or causing his issues not from the brain. ? ?Follow up plan: ?Return if symptoms worsen or fail to improve. ? ?Counseling provided for all of the vaccine components ?Orders Placed This Encounter  ?Procedures  ? Ambulatory referral to Neurosurgery  ? ? ?Caryl Pina, MD ?[place] ?10/01/2021, 2:19 PM ? ? ?  ?

## 2021-10-21 ENCOUNTER — Telehealth: Payer: Self-pay | Admitting: Family Medicine

## 2021-11-05 DIAGNOSIS — R531 Weakness: Secondary | ICD-10-CM | POA: Diagnosis not present

## 2021-11-05 DIAGNOSIS — Q2730 Arteriovenous malformation, site unspecified: Secondary | ICD-10-CM | POA: Diagnosis not present

## 2021-11-05 DIAGNOSIS — R202 Paresthesia of skin: Secondary | ICD-10-CM | POA: Diagnosis not present

## 2021-11-05 DIAGNOSIS — I639 Cerebral infarction, unspecified: Secondary | ICD-10-CM | POA: Diagnosis not present

## 2021-11-12 DIAGNOSIS — E785 Hyperlipidemia, unspecified: Secondary | ICD-10-CM | POA: Diagnosis not present

## 2021-11-12 DIAGNOSIS — R531 Weakness: Secondary | ICD-10-CM | POA: Diagnosis not present

## 2021-12-01 DIAGNOSIS — I471 Supraventricular tachycardia: Secondary | ICD-10-CM | POA: Diagnosis not present

## 2021-12-01 DIAGNOSIS — I491 Atrial premature depolarization: Secondary | ICD-10-CM | POA: Diagnosis not present

## 2021-12-02 DIAGNOSIS — R531 Weakness: Secondary | ICD-10-CM | POA: Diagnosis not present

## 2021-12-02 DIAGNOSIS — I5189 Other ill-defined heart diseases: Secondary | ICD-10-CM | POA: Diagnosis not present

## 2021-12-02 DIAGNOSIS — I3139 Other pericardial effusion (noninflammatory): Secondary | ICD-10-CM | POA: Diagnosis not present

## 2021-12-02 DIAGNOSIS — R202 Paresthesia of skin: Secondary | ICD-10-CM | POA: Diagnosis not present

## 2022-01-17 IMAGING — MG DIGITAL SCREENING BILAT W/ TOMO W/ CAD
6 of 12 series · 6 of 36 positions shown · non-contrast
Comparison: Previous exam(s).

CLINICAL DATA: Screening.

EXAM:
DIGITAL SCREENING BILATERAL MAMMOGRAM WITH TOMO AND CAD

[L MLO synth-2D]
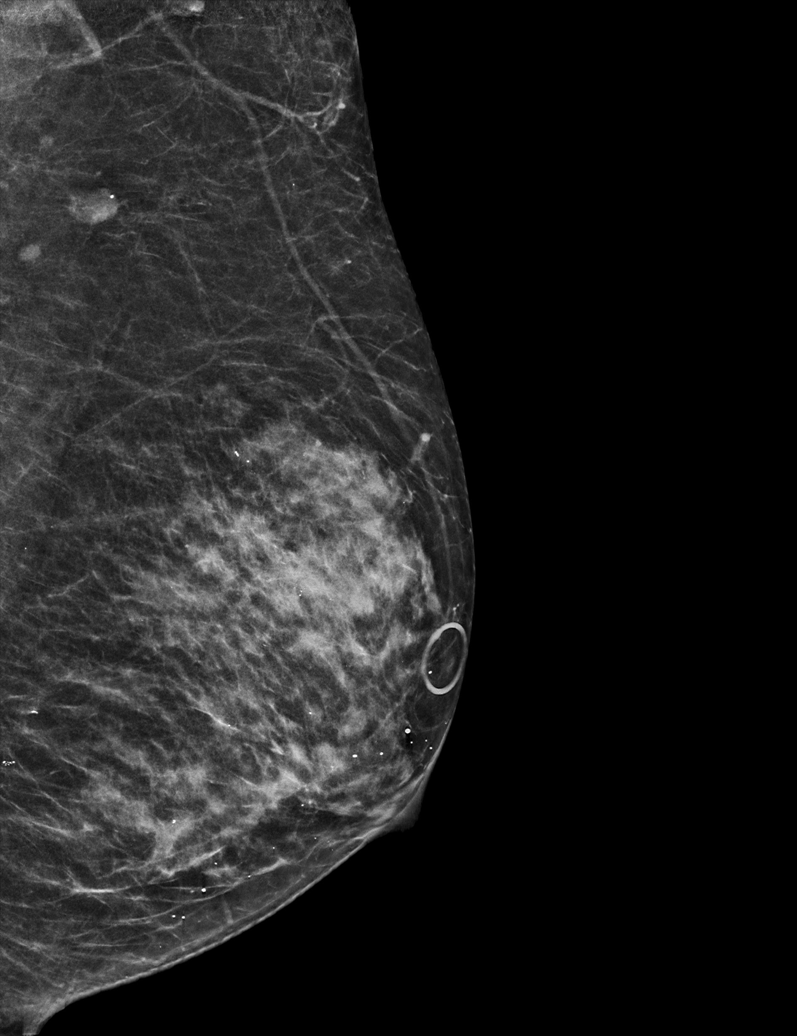

[R CC synth-2D (1 of 2)]
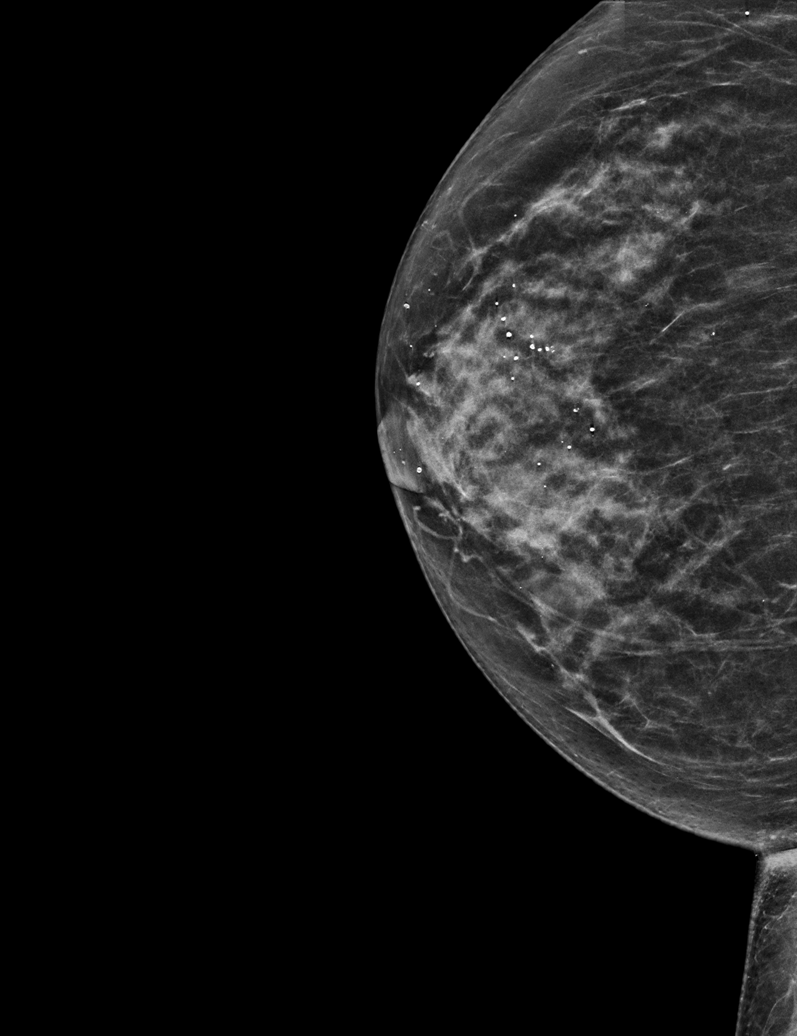

[R CC synth-2D (2 of 2)]
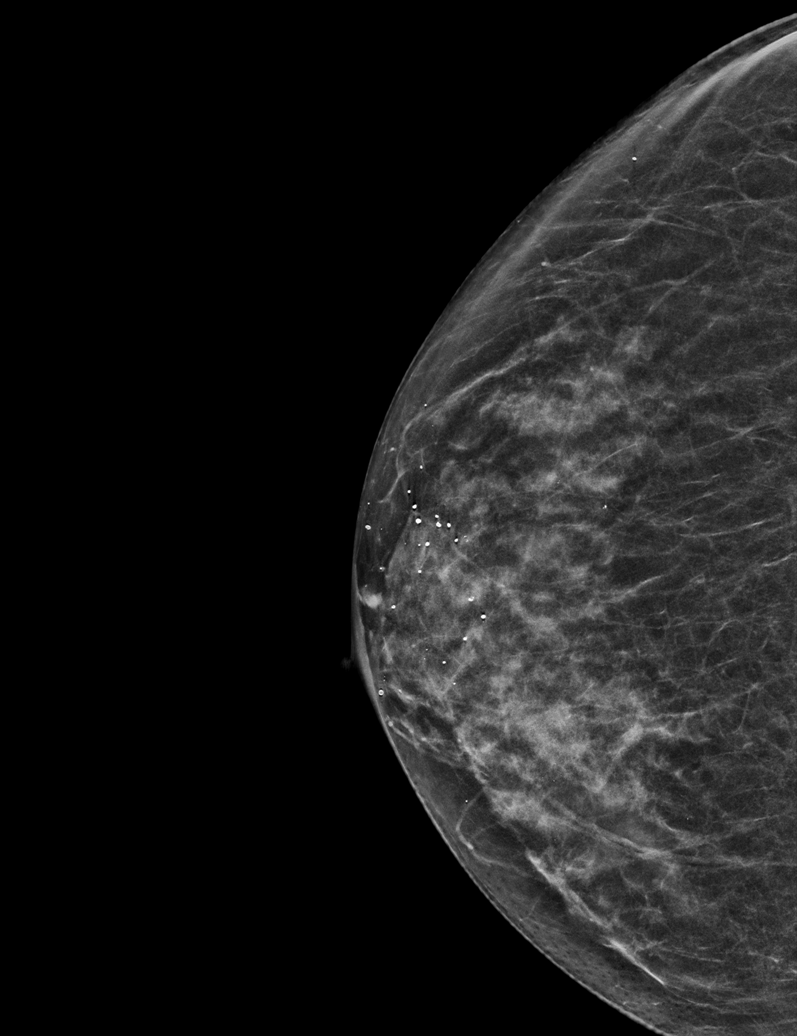

[L CC synth-2D]
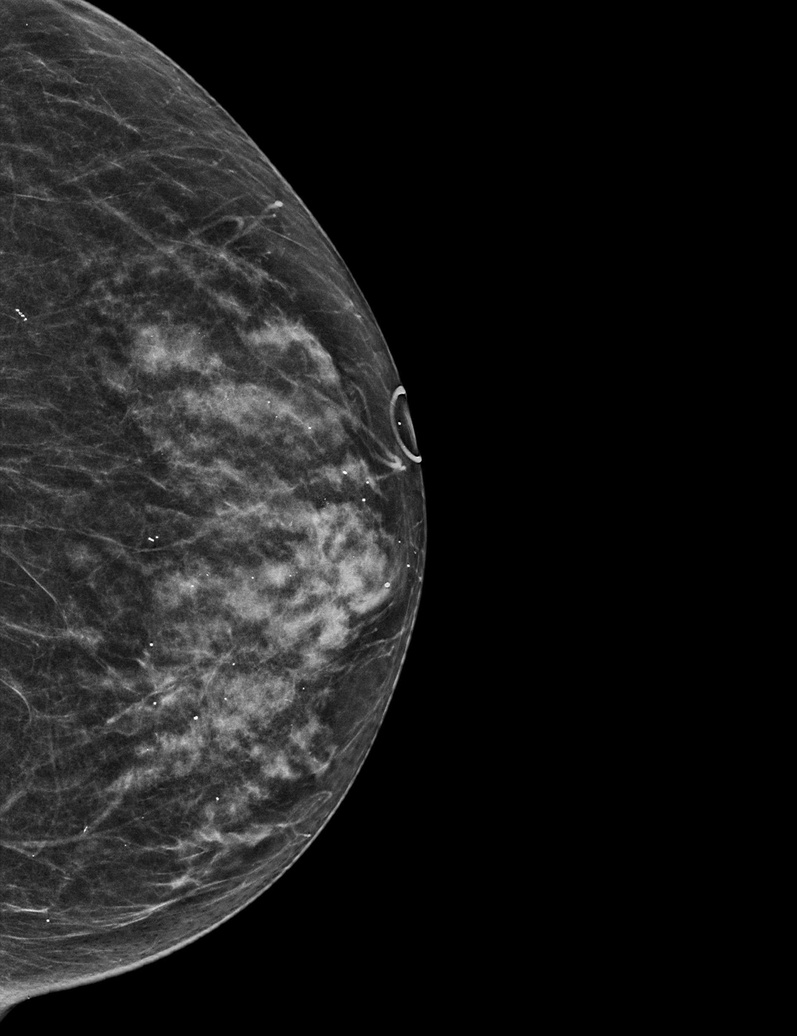

[R MLO synth-2D (1 of 2)]
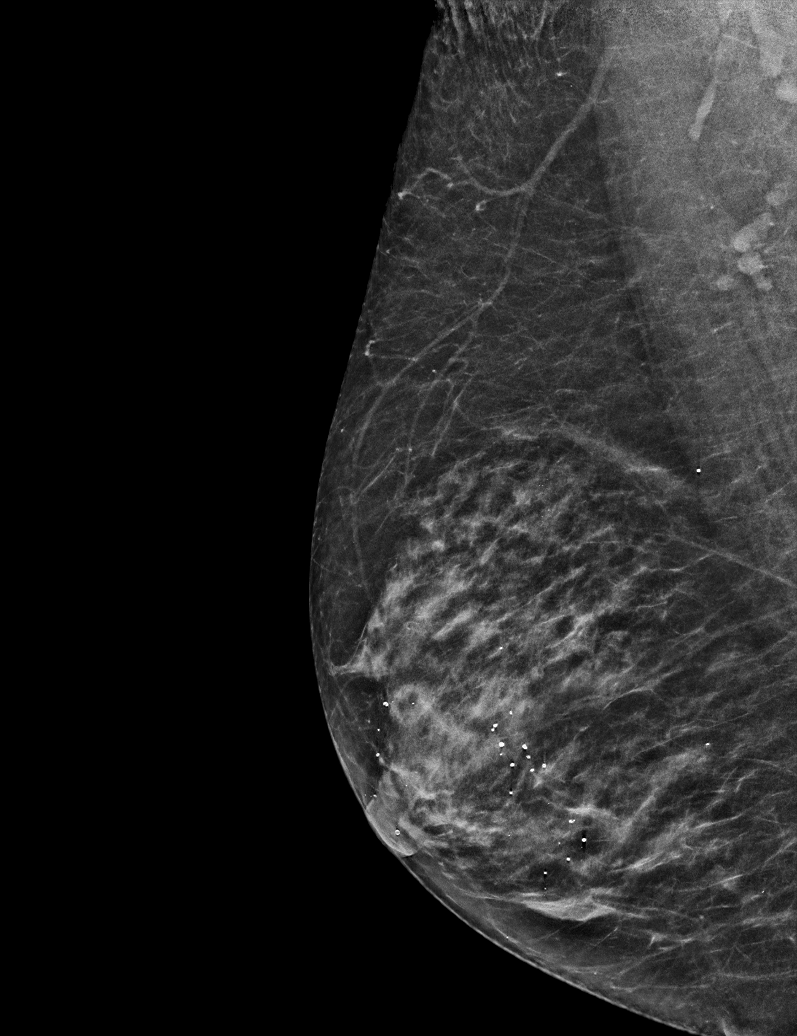

[R MLO synth-2D (2 of 2)]
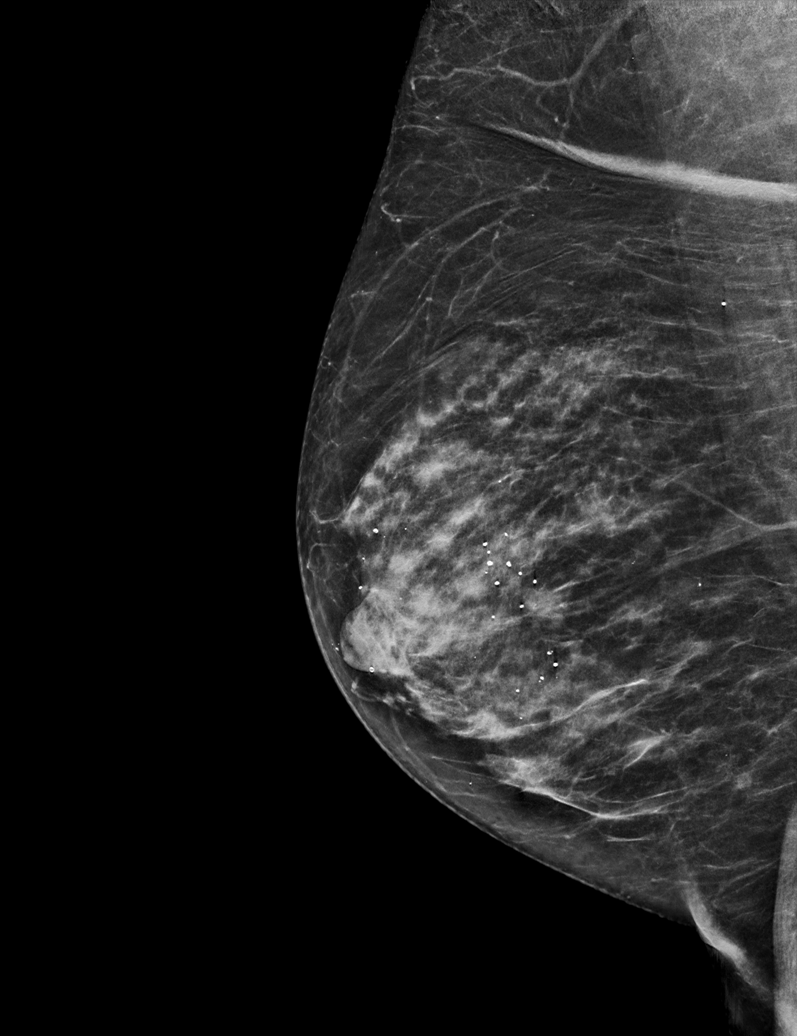

[6 of 36 positions shown; findings below may reference images not displayed]

ACR Breast Density Category c: The breast tissue is heterogeneously
dense, which may obscure small masses.
FINDINGS: There are no findings suspicious for malignancy. Images were
processed with CAD.
IMPRESSION: No mammographic evidence of malignancy. A result letter of this
screening mammogram will be mailed directly to the patient.

RECOMMENDATION:
Screening mammogram in one year. (Code:[id number]-U-LHB)

BI-RADS CATEGORY  1: Negative.

## 2022-02-10 ENCOUNTER — Encounter: Payer: Self-pay | Admitting: *Deleted

## 2022-02-18 DIAGNOSIS — Z79899 Other long term (current) drug therapy: Secondary | ICD-10-CM | POA: Diagnosis not present

## 2022-02-18 DIAGNOSIS — R202 Paresthesia of skin: Secondary | ICD-10-CM | POA: Diagnosis not present

## 2022-02-18 DIAGNOSIS — G8194 Hemiplegia, unspecified affecting left nondominant side: Secondary | ICD-10-CM | POA: Diagnosis not present

## 2022-02-18 DIAGNOSIS — Z9889 Other specified postprocedural states: Secondary | ICD-10-CM | POA: Diagnosis not present

## 2022-02-18 DIAGNOSIS — R531 Weakness: Secondary | ICD-10-CM | POA: Diagnosis not present

## 2022-02-18 DIAGNOSIS — Q282 Arteriovenous malformation of cerebral vessels: Secondary | ICD-10-CM | POA: Diagnosis not present

## 2022-02-18 DIAGNOSIS — Z7982 Long term (current) use of aspirin: Secondary | ICD-10-CM | POA: Diagnosis not present

## 2022-03-13 ENCOUNTER — Ambulatory Visit (INDEPENDENT_AMBULATORY_CARE_PROVIDER_SITE_OTHER): Payer: Medicaid Other | Admitting: Family Medicine

## 2022-03-13 ENCOUNTER — Ambulatory Visit (INDEPENDENT_AMBULATORY_CARE_PROVIDER_SITE_OTHER): Payer: Medicaid Other

## 2022-03-13 ENCOUNTER — Encounter: Payer: Self-pay | Admitting: Family Medicine

## 2022-03-13 VITALS — BP 138/85 | HR 61 | Temp 98.6°F | Ht 63.0 in | Wt 125.0 lb

## 2022-03-13 DIAGNOSIS — Z780 Asymptomatic menopausal state: Secondary | ICD-10-CM

## 2022-03-13 DIAGNOSIS — Z0000 Encounter for general adult medical examination without abnormal findings: Secondary | ICD-10-CM

## 2022-03-13 DIAGNOSIS — Z23 Encounter for immunization: Secondary | ICD-10-CM | POA: Diagnosis not present

## 2022-03-13 DIAGNOSIS — M8589 Other specified disorders of bone density and structure, multiple sites: Secondary | ICD-10-CM | POA: Diagnosis not present

## 2022-03-13 DIAGNOSIS — Z0001 Encounter for general adult medical examination with abnormal findings: Secondary | ICD-10-CM

## 2022-03-13 DIAGNOSIS — Q282 Arteriovenous malformation of cerebral vessels: Secondary | ICD-10-CM | POA: Diagnosis not present

## 2022-03-13 NOTE — Progress Notes (Signed)
BP 138/85   Pulse 61   Temp 98.6 F (37 C)   Ht _0  (1.6 m)   Wt 125 lb (56.7 kg)   SpO2 98%   BMI 22.14 kg/m    Subjective:   Patient ID: Gwendolyn Ortiz, female    DOB: February 14, 1956, 66 y.o.   MRN: [id number]  HPI: Gwendolyn Ortiz is a 66 y.o. female presenting on 03/13/2022 for Annual Exam   HPI Physical exam Patient is coming in today for physical exam.  Still has some numbness and tingling on the left side of her body but it is improving.  She did go see neurology for this and they did not find anything new and that it was still related to the AVM but did not find anything that would surgically needed to be repaired at this point.  She does have a follow-up with them in December. Patient denies any chest pain, shortness of breath, headaches or vision issues, abdominal complaints, diarrhea, nausea, vomiting, or joint issues.   Relevant past medical, surgical, family and social history reviewed and updated as indicated. Interim medical history since our last visit reviewed. Allergies and medications reviewed and updated.  Review of Systems  Constitutional:  Negative for chills and fever.  HENT:  Negative for congestion, ear discharge and ear pain.   Eyes:  Negative for redness and visual disturbance.  Respiratory:  Negative for chest tightness and shortness of breath.   Cardiovascular:  Negative for chest pain and leg swelling.  Genitourinary:  Negative for difficulty urinating and dysuria.  Musculoskeletal:  Negative for back pain and gait problem.  Skin:  Negative for rash.  Neurological:  Negative for light-headedness and headaches.  Psychiatric/Behavioral:  Negative for agitation and behavioral problems.   All other systems reviewed and are negative.   Per HPI unless specifically indicated above   Allergies as of 03/13/2022   No Known Allergies      Medication List        Accurate as of March 13, 2022 11:24 AM. If you have any questions, ask your nurse or  doctor.          STOP taking these medications    predniSONE 20 MG tablet Commonly known as: DELTASONE Stopped by: Fransisca Kaufmann Oreoluwa Gilmer, MD       TAKE these medications    DAILY MULTI 50+ PO Take 1 tablet by mouth daily.         Objective:   BP 138/85   Pulse 61   Temp 98.6 F (37 C)   Ht _1  (1.6 m)   Wt 125 lb (56.7 kg)   SpO2 98%   BMI 22.14 kg/m   Wt Readings from Last 3 Encounters:  03/13/22 125 lb (56.7 kg)  10/01/21 123 lb (55.8 kg)  09/29/21 135 lb (61.2 kg)    Physical Exam Vitals and nursing note reviewed.  Constitutional:      General: She is not in acute distress.    Appearance: She is well-developed. She is not diaphoretic.  Eyes:     Conjunctiva/sclera: Conjunctivae normal.  Cardiovascular:     Rate and Rhythm: Normal rate and regular rhythm.     Heart sounds: Normal heart sounds. No murmur heard. Pulmonary:     Effort: Pulmonary effort is normal. No respiratory distress.     Breath sounds: Normal breath sounds. No wheezing.  Musculoskeletal:        General: No tenderness. Normal range of motion.  Skin:  General: Skin is warm and dry.     Findings: No rash.  Neurological:     Mental Status: She is alert and oriented to person, place, and time.     Coordination: Coordination normal.  Psychiatric:        Behavior: Behavior normal.       Assessment & Plan:   Problem List Items Addressed This Visit       Cardiovascular and Mediastinum   Arteriovenous malformation of cerebral vessels   Relevant Orders   CBC with Differential/Platelet   CMP14+EGFR   Lipid panel   DG [facility] DEXA   Other Visit Diagnoses     Physical exam    -  Primary   Relevant Orders   CBC with Differential/Platelet   CMP14+EGFR   Lipid panel   DG [facility] DEXA   Postmenopausal       Relevant Orders   CBC with Differential/Platelet   CMP14+EGFR   Lipid panel   DG [facility] DEXA       Continue current medicine, seems to be doing well.  Her numbness is  improving but still just on the left side of her body like she had previously.  She is still following up with neurosurgery for this. Follow up plan: Return in about 1 year (around 03/14/2023), or if symptoms worsen or fail to improve, for physical, needs AWV too.  Counseling provided for all of the vaccine components Orders Placed This Encounter  Procedures   DG [facility] DEXA   CBC with Differential/Platelet   CMP14+EGFR   Lipid panel    Caryl Pina, MD [facility] 03/13/2022, 11:24 AM    Managed

## 2022-03-17 LAB — CBC WITH DIFFERENTIAL/PLATELET
Basophils Absolute: 0.1 10*3/uL (ref 0.0–0.2)
Basos: 1 %
EOS (ABSOLUTE): 0.1 10*3/uL (ref 0.0–0.4)
Eos: 3 %
Hematocrit: 41.1 % (ref 34.0–46.6)
Hemoglobin: 13.7 g/dL (ref 11.1–15.9)
Immature Grans (Abs): 0 10*3/uL (ref 0.0–0.1)
Immature Granulocytes: 0 %
Lymphocytes Absolute: 1.1 10*3/uL (ref 0.7–3.1)
Lymphs: 27 %
MCH: 30.7 pg (ref 26.6–33.0)
MCHC: 33.3 g/dL (ref 31.5–35.7)
MCV: 92 fL (ref 79–97)
Monocytes Absolute: 0.3 10*3/uL (ref 0.1–0.9)
Monocytes: 7 %
Neutrophils Absolute: 2.5 10*3/uL (ref 1.4–7.0)
Neutrophils: 62 %
Platelets: 223 10*3/uL (ref 150–450)
RBC: 4.46 x10E6/uL (ref 3.77–5.28)
RDW: 12 % (ref 11.7–15.4)
WBC: 4.1 10*3/uL (ref 3.4–10.8)

## 2022-03-17 LAB — LIPID PANEL
Chol/HDL Ratio: 2.1 ratio (ref 0.0–4.4)
Cholesterol, Total: 140 mg/dL (ref 100–199)
HDL: 68 mg/dL (ref >39)
LDL Chol Calc (NIH): 58 mg/dL (ref 0–99)
Triglycerides: 73 mg/dL (ref 0–149)
VLDL Cholesterol Cal: 14 mg/dL (ref 5–40)

## 2022-03-17 LAB — CMP14+EGFR
ALT: 24 IU/L (ref 0–32)
AST: 28 IU/L (ref 0–40)
Albumin/Globulin Ratio: 1.9 (ref 1.2–2.2)
Albumin: 4.5 g/dL (ref 3.9–4.9)
Alkaline Phosphatase: 67 IU/L (ref 44–121)
BUN/Creatinine Ratio: 14 (ref 12–28)
BUN: 11 mg/dL (ref 8–27)
Bilirubin Total: 0.3 mg/dL (ref 0.0–1.2)
CO2: 23 mmol/L (ref 20–29)
Calcium: 9.5 mg/dL (ref 8.7–10.3)
Chloride: 105 mmol/L (ref 96–106)
Creatinine, Ser: 0.77 mg/dL (ref 0.57–1.00)
Globulin, Total: 2.4 g/dL (ref 1.5–4.5)
Glucose: 100 mg/dL — ABNORMAL HIGH (ref 70–99)
Potassium: 4.4 mmol/L (ref 3.5–5.2)
Sodium: 143 mmol/L (ref 134–144)
Total Protein: 6.9 g/dL (ref 6.0–8.5)
eGFR: 86 mL/min/{1.73_m2} (ref >59)

## 2022-03-20 ENCOUNTER — Encounter: Payer: Medicaid Other | Admitting: Family Medicine

## 2022-05-13 ENCOUNTER — Other Ambulatory Visit: Payer: Self-pay | Admitting: Family Medicine

## 2022-05-13 DIAGNOSIS — Z1231 Encounter for screening mammogram for malignant neoplasm of breast: Secondary | ICD-10-CM

## 2022-05-25 ENCOUNTER — Ambulatory Visit
Admission: RE | Admit: 2022-05-25 | Discharge: 2022-05-25 | Disposition: A | Discharge status: Home / Self Care | Payer: Medicaid Other | Source: Ambulatory Visit | Attending: Family Medicine | Admitting: Family Medicine

## 2022-05-25 DIAGNOSIS — Z1231 Encounter for screening mammogram for malignant neoplasm of breast: Secondary | ICD-10-CM

## 2022-06-10 DIAGNOSIS — H5213 Myopia, bilateral: Secondary | ICD-10-CM | POA: Diagnosis not present

## 2022-06-11 DIAGNOSIS — R200 Anesthesia of skin: Secondary | ICD-10-CM | POA: Diagnosis not present

## 2022-06-11 DIAGNOSIS — R202 Paresthesia of skin: Secondary | ICD-10-CM | POA: Diagnosis not present

## 2022-06-12 ENCOUNTER — Ambulatory Visit: Payer: Medicaid Other | Admitting: Family Medicine

## 2022-06-15 ENCOUNTER — Ambulatory Visit: Payer: Medicaid Other | Admitting: Family Medicine

## 2022-06-15 ENCOUNTER — Encounter: Payer: Self-pay | Admitting: Family Medicine

## 2022-06-15 VITALS — BP 133/76 | HR 88 | Ht 63.0 in | Wt 129.0 lb

## 2022-06-15 DIAGNOSIS — H02403 Unspecified ptosis of bilateral eyelids: Secondary | ICD-10-CM | POA: Diagnosis not present

## 2022-06-15 NOTE — Progress Notes (Signed)
BP 133/76   Pulse 88   Ht 5\' 3"  (1.6 m)   Wt 129 lb (58.5 kg)   SpO2 100%   BMI 22.85 kg/m    Subjective:   Patient ID: Gwendolyn Ortiz, female    DOB: 03/31/1956, 67 y.o.   MRN: [id number]  HPI: Gwendolyn Ortiz is a 67 y.o. female presenting on 06/15/2022 for eyelid concern (Optometry wanted pt seen. Pt is unsure why. Records requesting from [facility] in [place])   HPI Eyelid droop As best as I can tell and she can remember the optometrist sent her over here because of her allergies and because her eyes do not open all the way and to discuss options.  He was concerned about it affecting her vision but she says it is running in her family and that it does not affect her vision currently but she does not want to see a surgeon about it.  She thinks that is what is something for her because she is not having any visual deficits.  We are going to call over and get that note from their office but it has not been sent to [place] as of yet.  Relevant past medical, surgical, family and social history reviewed and updated as indicated. Interim medical history since our last visit reviewed. Allergies and medications reviewed and updated.  Review of Systems  Constitutional:  Negative for chills and fever.  HENT:  Negative for congestion, ear discharge and ear pain.   Eyes:  Negative for redness and visual disturbance.  Respiratory:  Negative for chest tightness and shortness of breath.   Cardiovascular:  Negative for chest pain and leg swelling.  Genitourinary:  Negative for difficulty urinating and dysuria.  Musculoskeletal:  Negative for back pain and gait problem.  Skin:  Negative for rash.  Neurological:  Negative for light-headedness and headaches.  Psychiatric/Behavioral:  Negative for agitation and behavioral problems.   All other systems reviewed and are negative.   Per HPI unless specifically indicated above   Allergies as of 06/15/2022   No Known Allergies      Medication List         Accurate as of June 15, 2022  4:18 PM. If you have any questions, ask your nurse or doctor.          [employer] Aspirin Adult Low Dose 81 MG chewable tablet Generic drug: aspirin Chew 81 mg by mouth daily.   DAILY MULTI 50+ PO Take 1 tablet by mouth daily.   rosuvastatin 10 MG tablet Commonly known as: CRESTOR Take 10 mg by mouth daily.         Objective:   BP 133/76   Pulse 88   Ht 5\' 3"  (1.6 m)   Wt 129 lb (58.5 kg)   SpO2 100%   BMI 22.85 kg/m   Wt Readings from Last 3 Encounters:  06/15/22 129 lb (58.5 kg)  03/13/22 125 lb (56.7 kg)  10/01/21 123 lb (55.8 kg)    Physical Exam Vitals and nursing note reviewed.  Constitutional:      Appearance: Normal appearance.  Eyes:     General: Allergic shiner present.     Extraocular Movements: Extraocular movements intact.     Conjunctiva/sclera: Conjunctivae normal.     Comments: Eyelid droop slightly on both sides but she has had for some time.  Neurological:     Mental Status: She is alert.       Assessment & Plan:   Problem List Items  Addressed This Visit   None Visit Diagnoses     Eyelid drooping disease, bilateral    -  Primary     Discussed going to see a surgeon and patient says it is not affecting her vision and it runs in her family she does not want to do any surgery for it at this point.  Follow up plan: Return if symptoms worsen or fail to improve.  Counseling provided for all of the vaccine components No orders of the defined types were placed in this encounter.   Caryl Pina, MD [facility] 06/15/2022, 4:18 PM

## 2022-06-22 ENCOUNTER — Telehealth: Payer: Self-pay | Admitting: Family Medicine

## 2022-06-22 NOTE — Telephone Encounter (Signed)
We can make a letter for her stating what diagnosis she has, it looks like the neurology has said arteriovenous malformation with left-sided weakness in her history.  We can say that she has osteoarthritis that is fine but I do not have any history that I know of fibromyalgia.  I do not see that diagnosis so I do not think we can put that but we can put osteoarthritis and the arteriovenous malformation

## 2022-06-23 NOTE — Telephone Encounter (Signed)
Left message with pt to have Violet, her niece to call the office back. Tried [employer] but there was no answer and vmail is not set up.  Need more info:  What does pt need help with during the day>

## 2022-06-23 NOTE — Telephone Encounter (Signed)
Gwendolyn Ortiz says pt needs helping getting around and using her hands. Says her hands are all curled up and its hard to get them straight. Also says that its hard for pt to get up sometimes because when she does she gets dizzy. Family does not want her to be left alone. Wants someone to be with her at all times because she's not able to do for herself without issues.

## 2022-06-24 NOTE — Telephone Encounter (Signed)
Yes we can put severe arthritis in her hands in the letter if they want [place] to, you can also place in that she has difficulty doing some ADLs because of her hands.

## 2022-06-24 NOTE — Telephone Encounter (Signed)
Letter has been completed and signed by Dr. Warrick Parisian. Tried [employer].No answer and vmail not set up. Spoke with pt and she understood that the letter is complete and that Wilhemena Durie could come by today before 5 to pick up.  Letter up front in G folder.

## 2022-12-14 DIAGNOSIS — R200 Anesthesia of skin: Secondary | ICD-10-CM | POA: Diagnosis not present

## 2022-12-14 DIAGNOSIS — R202 Paresthesia of skin: Secondary | ICD-10-CM | POA: Diagnosis not present

## 2023-03-15 ENCOUNTER — Ambulatory Visit (INDEPENDENT_AMBULATORY_CARE_PROVIDER_SITE_OTHER): Payer: Medicaid Other | Admitting: Family Medicine

## 2023-03-15 ENCOUNTER — Encounter: Payer: Self-pay | Admitting: Family Medicine

## 2023-03-15 VITALS — BP 126/70 | HR 85 | Ht 63.0 in | Wt 124.0 lb

## 2023-03-15 DIAGNOSIS — Z0001 Encounter for general adult medical examination with abnormal findings: Secondary | ICD-10-CM

## 2023-03-15 DIAGNOSIS — E785 Hyperlipidemia, unspecified: Secondary | ICD-10-CM | POA: Insufficient documentation

## 2023-03-15 DIAGNOSIS — Z23 Encounter for immunization: Secondary | ICD-10-CM

## 2023-03-15 DIAGNOSIS — Q282 Arteriovenous malformation of cerebral vessels: Secondary | ICD-10-CM | POA: Diagnosis not present

## 2023-03-15 DIAGNOSIS — Z0000 Encounter for general adult medical examination without abnormal findings: Secondary | ICD-10-CM | POA: Diagnosis not present

## 2023-03-15 DIAGNOSIS — E7800 Pure hypercholesterolemia, unspecified: Secondary | ICD-10-CM

## 2023-03-15 NOTE — Progress Notes (Signed)
BP 126/70   Pulse 85   Ht 5\' 3"  (1.6 m)   Wt 124 lb (56.2 kg)   SpO2 99%   BMI 21.97 kg/m    Subjective:   Patient ID: Gwendolyn Ortiz, female    DOB: 1956-04-11, 67 y.o.   MRN: [id number]  HPI: Gwendolyn Ortiz is a 67 y.o. female presenting on 03/15/2023 for Medical Management of Chronic Issues (CPE, no pap, breast exam only)   HPI Physical exam Patient denies any chest pain, shortness of breath, headaches or vision issues, abdominal complaints, diarrhea, nausea, vomiting, or joint issues.   Hyperlipidemia Patient is coming in for recheck of his hyperlipidemia. The patient is currently taking Crestor. They deny any issues with myalgias or history of liver damage from it. They deny any focal numbness or weakness or chest pain.   AV malformation of cerebral vessels Patient follows with neurology for AV malformation of cerebral vessels.  Relevant past medical, surgical, family and social history reviewed and updated as indicated. Interim medical history since our last visit reviewed. Allergies and medications reviewed and updated.  Review of Systems  Constitutional:  Negative for chills and fever.  HENT:  Negative for congestion, ear discharge, ear pain and tinnitus.   Eyes:  Negative for pain, redness and visual disturbance.  Respiratory:  Negative for cough, chest tightness, shortness of breath and wheezing.   Cardiovascular:  Negative for chest pain, palpitations and leg swelling.  Gastrointestinal:  Negative for abdominal pain, blood in stool, constipation and diarrhea.  Genitourinary:  Negative for difficulty urinating, dysuria and hematuria.  Musculoskeletal:  Negative for back pain, gait problem and myalgias.  Skin:  Negative for rash.  Neurological:  Positive for numbness. Negative for dizziness, weakness, light-headedness and headaches.  Psychiatric/Behavioral:  Negative for agitation, behavioral problems and suicidal ideas.   All other systems reviewed and are  negative.   Per HPI unless specifically indicated above   Allergies as of 03/15/2023   No Known Allergies      Medication List        Accurate as of March 15, 2023 10:30 AM. If you have any questions, ask your nurse or doctor.          cholecalciferol 25 MCG (1000 UNIT) tablet Commonly known as: VITAMIN D3 Take 1,000 Units by mouth daily.   CVS Aspirin Adult Low Dose 81 MG chewable tablet Generic drug: aspirin Chew 81 mg by mouth daily.   DAILY MULTI 50+ PO Take 1 tablet by mouth daily.   rosuvastatin 10 MG tablet Commonly known as: CRESTOR Take 10 mg by mouth daily.         Objective:   BP 126/70   Pulse 85   Ht 5\' 3"  (1.6 m)   Wt 124 lb (56.2 kg)   SpO2 99%   BMI 21.97 kg/m   Wt Readings from Last 3 Encounters:  03/15/23 124 lb (56.2 kg)  06/15/22 129 lb (58.5 kg)  03/13/22 125 lb (56.7 kg)    Physical Exam Vitals reviewed.  Constitutional:      General: She is not in acute distress.    Appearance: She is well-developed. She is not diaphoretic.  Eyes:     Conjunctiva/sclera: Conjunctivae normal.  Neck:     Thyroid: No thyromegaly.  Cardiovascular:     Rate and Rhythm: Normal rate and regular rhythm.     Heart sounds: Normal heart sounds. No murmur heard. Pulmonary:     Effort: Pulmonary effort is normal. No respiratory  distress.     Breath sounds: Normal breath sounds. No wheezing.  Chest:  Breasts:    Breasts are symmetrical.     Right: No inverted nipple, mass, nipple discharge, skin change or tenderness.     Left: No inverted nipple, mass, nipple discharge, skin change or tenderness.  Abdominal:     General: Bowel sounds are normal. There is no distension.     Palpations: Abdomen is soft.     Tenderness: There is no abdominal tenderness. There is no guarding or rebound.  Musculoskeletal:        General: No swelling. Normal range of motion.     Cervical back: Neck supple.  Lymphadenopathy:     Cervical: No cervical adenopathy.   Skin:    General: Skin is warm and dry.     Findings: No rash.  Neurological:     Mental Status: She is alert and oriented to person, place, and time.     Coordination: Coordination normal.  Psychiatric:        Behavior: Behavior normal.       Assessment & Plan:   Problem List Items Addressed This Visit       Cardiovascular and Mediastinum   Arteriovenous malformation of cerebral vessels     Other   Hyperlipidemia   Relevant Orders   CBC with Differential/Platelet   CMP14+EGFR   Lipid panel   Other Visit Diagnoses     Physical exam    -  Primary   Relevant Orders   CBC with Differential/Platelet   CMP14+EGFR   Lipid panel       Will do blood work today on the way out.  She seems to be doing well, still sees neurology and takes Crestor and aspirin.  She says she supposed to get another scan next year for the AV malformation but she says symptoms are improving.  She still gets a little bit of subjective numbness on her left leg. Follow up plan: Return in about 1 year (around 03/14/2024), or if symptoms worsen or fail to improve, for Physical exam.  Counseling provided for all of the vaccine components Orders Placed This Encounter  Procedures   CBC with Differential/Platelet   CMP14+EGFR   Lipid panel    Arville Care, MD [facility] 03/15/2023, 10:30 AM

## 2023-03-16 LAB — CBC WITH DIFFERENTIAL/PLATELET
Basophils Absolute: 0 10*3/uL (ref 0.0–0.2)
Basos: 1 %
EOS (ABSOLUTE): 0.1 10*3/uL (ref 0.0–0.4)
Eos: 2 %
Hematocrit: 42.3 % (ref 34.0–46.6)
Hemoglobin: 13.8 g/dL (ref 11.1–15.9)
Immature Grans (Abs): 0 10*3/uL (ref 0.0–0.1)
Immature Granulocytes: 0 %
Lymphocytes Absolute: 1.2 10*3/uL (ref 0.7–3.1)
Lymphs: 22 %
MCH: 30.6 pg (ref 26.6–33.0)
MCHC: 32.6 g/dL (ref 31.5–35.7)
MCV: 94 fL (ref 79–97)
Monocytes Absolute: 0.3 10*3/uL (ref 0.1–0.9)
Monocytes: 6 %
Neutrophils Absolute: 3.7 10*3/uL (ref 1.4–7.0)
Neutrophils: 69 %
Platelets: 204 10*3/uL (ref 150–450)
RBC: 4.51 x10E6/uL (ref 3.77–5.28)
RDW: 12.2 % (ref 11.7–15.4)
WBC: 5.3 10*3/uL (ref 3.4–10.8)

## 2023-03-16 LAB — CMP14+EGFR
ALT: 23 [IU]/L (ref 0–32)
AST: 31 [IU]/L (ref 0–40)
Albumin: 4.7 g/dL (ref 3.9–4.9)
Alkaline Phosphatase: 75 IU/L (ref 44–121)
BUN/Creatinine Ratio: 14 (ref 12–28)
BUN: 12 mg/dL (ref 8–27)
Bilirubin Total: 0.5 mg/dL (ref 0.0–1.2)
CO2: 25 mmol/L (ref 20–29)
Calcium: 9.9 mg/dL (ref 8.7–10.3)
Chloride: 106 mmol/L (ref 96–106)
Creatinine, Ser: 0.83 mg/dL (ref 0.57–1.00)
Globulin, Total: 2.5 g/dL (ref 1.5–4.5)
Glucose: 98 mg/dL (ref 70–99)
Potassium: 4.3 mmol/L (ref 3.5–5.2)
Sodium: 144 mmol/L (ref 134–144)
Total Protein: 7.2 g/dL (ref 6.0–8.5)
eGFR: 78 mL/min/{1.73_m2} (ref >59)

## 2023-03-16 LAB — LIPID PANEL
Chol/HDL Ratio: 1.7 ratio (ref 0.0–4.4)
Cholesterol, Total: 147 mg/dL (ref 100–199)
HDL: 86 mg/dL (ref >39)
LDL Chol Calc (NIH): 50 mg/dL (ref 0–99)
Triglycerides: 51 mg/dL (ref 0–149)
VLDL Cholesterol Cal: 11 mg/dL (ref 5–40)

## 2023-06-04 ENCOUNTER — Other Ambulatory Visit: Payer: Self-pay | Admitting: Family Medicine

## 2023-06-04 DIAGNOSIS — Z1231 Encounter for screening mammogram for malignant neoplasm of breast: Secondary | ICD-10-CM

## 2023-06-09 ENCOUNTER — Ambulatory Visit
Admission: RE | Admit: 2023-06-09 | Discharge: 2023-06-09 | Disposition: A | Discharge status: Home / Self Care | Payer: Medicaid Other | Source: Ambulatory Visit | Attending: Family Medicine

## 2023-06-09 DIAGNOSIS — Z1231 Encounter for screening mammogram for malignant neoplasm of breast: Secondary | ICD-10-CM | POA: Diagnosis not present

## 2023-06-11 ENCOUNTER — Other Ambulatory Visit: Payer: Self-pay

## 2023-06-11 ENCOUNTER — Encounter: Payer: Self-pay | Admitting: Family Medicine

## 2023-06-11 DIAGNOSIS — R928 Other abnormal and inconclusive findings on diagnostic imaging of breast: Secondary | ICD-10-CM

## 2023-06-16 NOTE — Progress Notes (Signed)
Ordered faxed to [facility]

## 2023-06-22 ENCOUNTER — Other Ambulatory Visit (HOSPITAL_COMMUNITY): Payer: Self-pay | Admitting: Family Medicine

## 2023-06-22 DIAGNOSIS — R928 Other abnormal and inconclusive findings on diagnostic imaging of breast: Secondary | ICD-10-CM

## 2023-06-23 ENCOUNTER — Telehealth: Payer: Self-pay | Admitting: Family Medicine

## 2023-06-23 NOTE — Telephone Encounter (Signed)
 Multiple attempts have been made to contact the pt but no response.   Copied from CRM [id number]. Topic: Clinical - Request for Lab/Test Order >> Jun 23, 2023 12:53 PM Delon DASEN wrote: Reason for CRM: Devere with [facility] is trying to reach the patient, every time they call her she says hello and then when they start talking she hangs up, need assistance to reach the patient

## 2023-06-23 NOTE — Telephone Encounter (Signed)
Left message with receptionist at [facility] informing to contact niece to schedule appts since she is english speaking.

## 2023-07-03 IMAGING — CT CT HEAD W/O CM
4 series · 16 of 47 positions shown, 18 images · non-contrast
Comparison: CT head October 09, 2008.

CLINICAL DATA: Neuro deficit, acute, stroke suspected



[Series 2: head wo · axial · 0.36mm/px · z∈[+1286,+1401]mm · 6 of 33 slices shown, 8 images]
[im 5/33  brain]
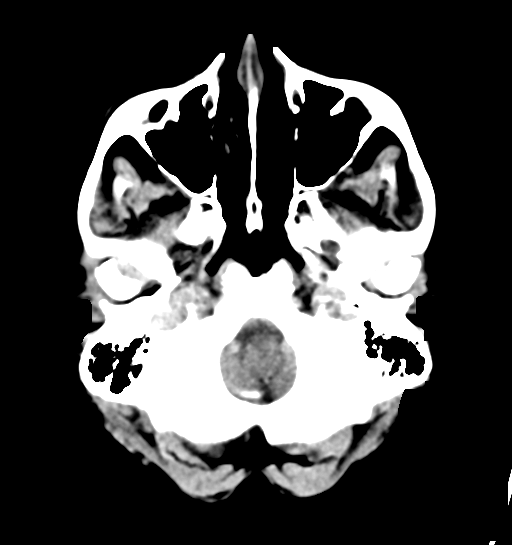
[im 5/33  bone]
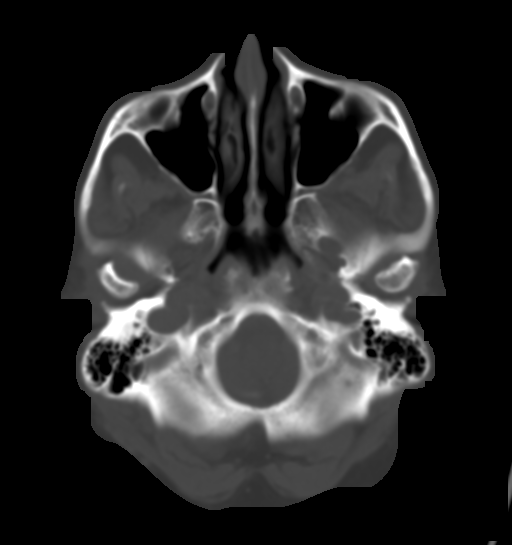
[im 10/33  brain]
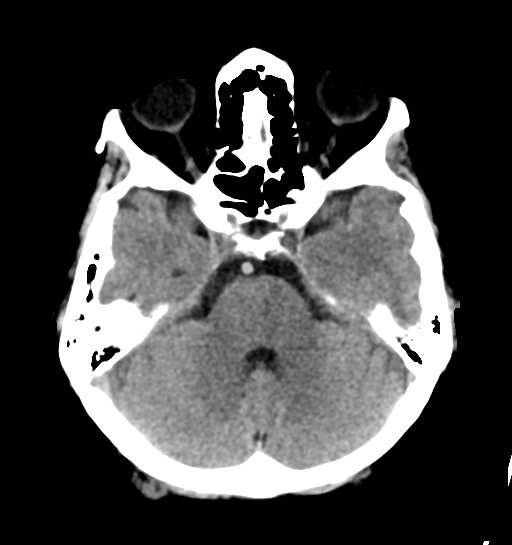
[im 14/33  brain]
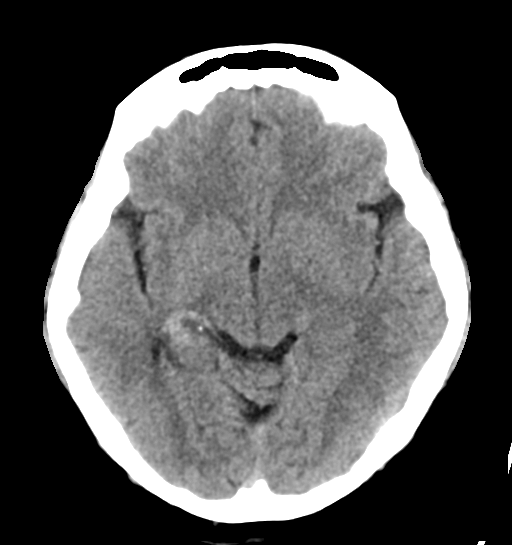
[im 19/33  brain]
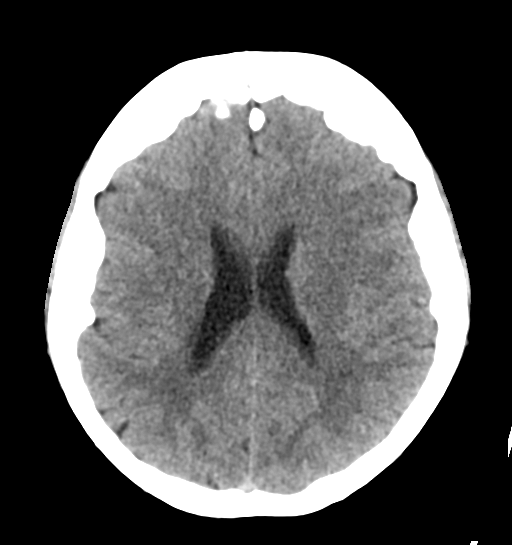
[im 23/33  brain]
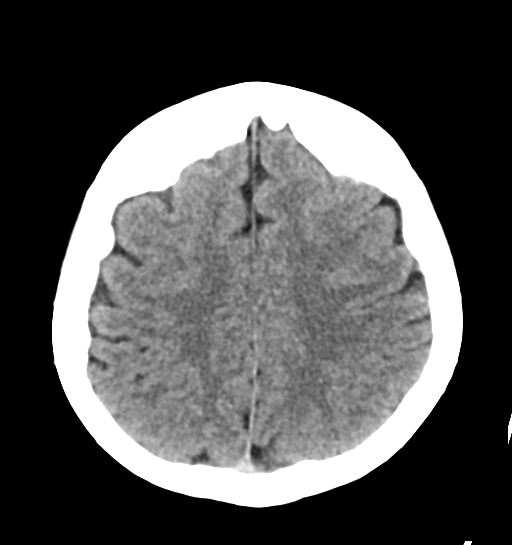
[im 23/33  bone]
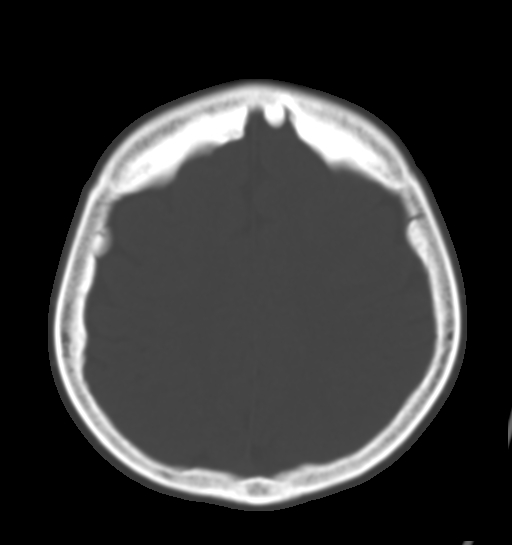
[im 28/33  brain]
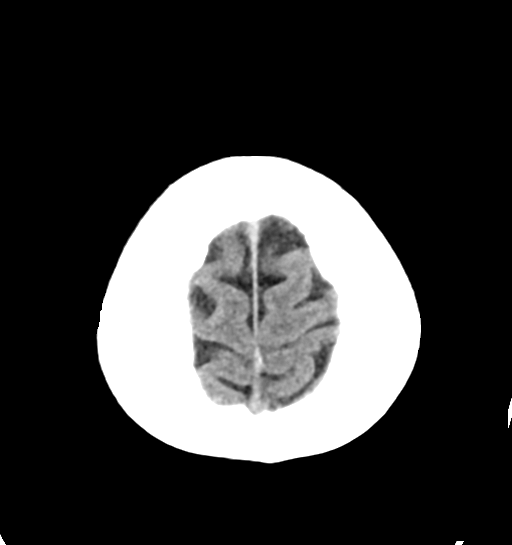

[Series 3: head bone · axial · 0.39mm/px · z∈[+1277,+1333]mm · 4 of 85 slices shown]
[im 9/85  bone]
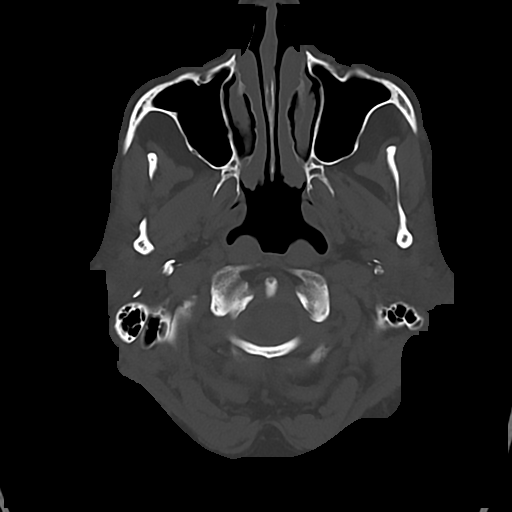
[im 17/85  bone]
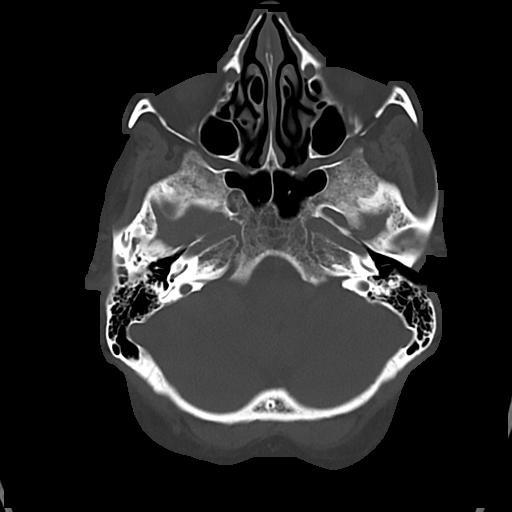
[im 29/85  bone]
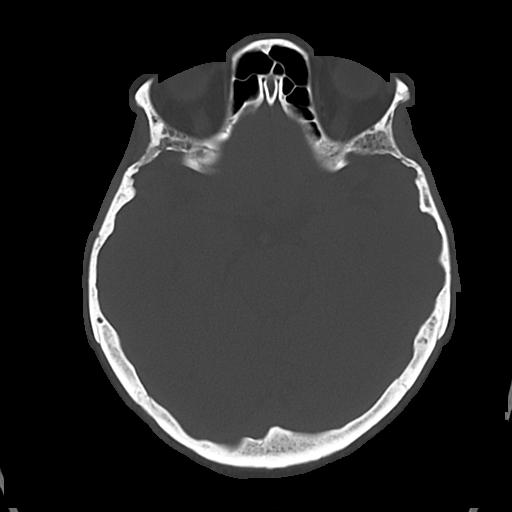
[im 37/85  bone]
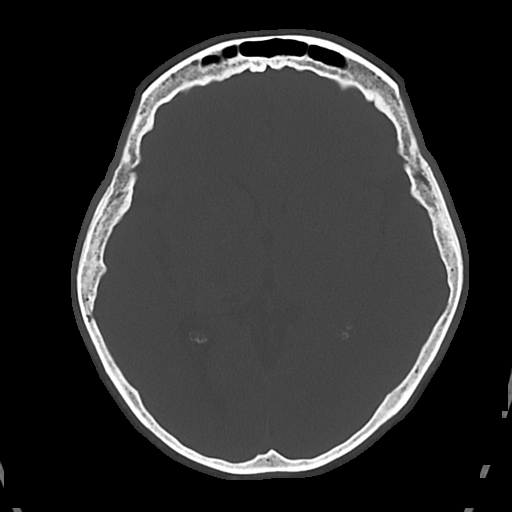

[Series 4: cor soft · coronal · 0.31mm/px · 3 of 66 slices shown]
[im 22/66  brain]
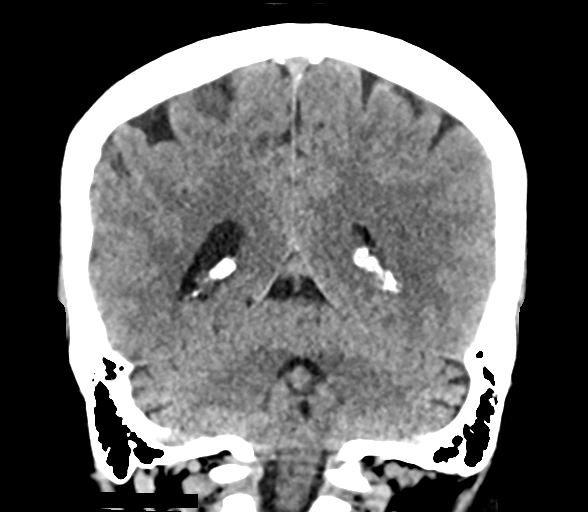
[im 29/66  brain]
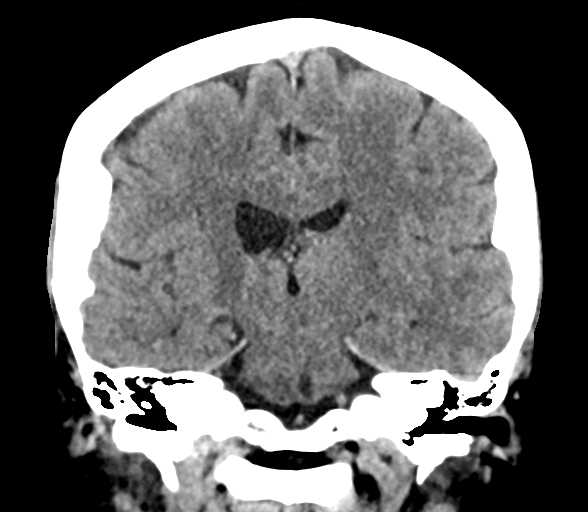
[im 37/66  brain]
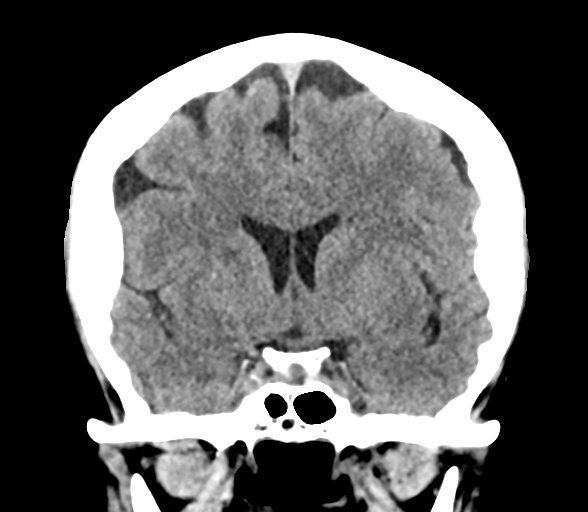

[Series 5: sag soft · sagittal · 0.31mm/px · 3 of 62 slices shown]
[im 21/62  brain]
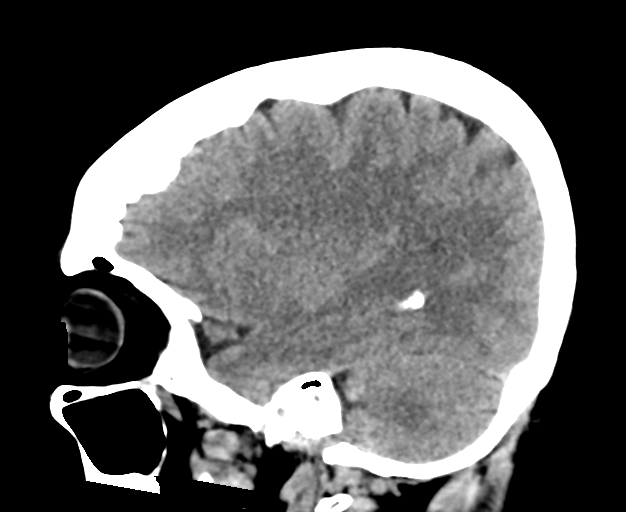
[im 31/62  brain]
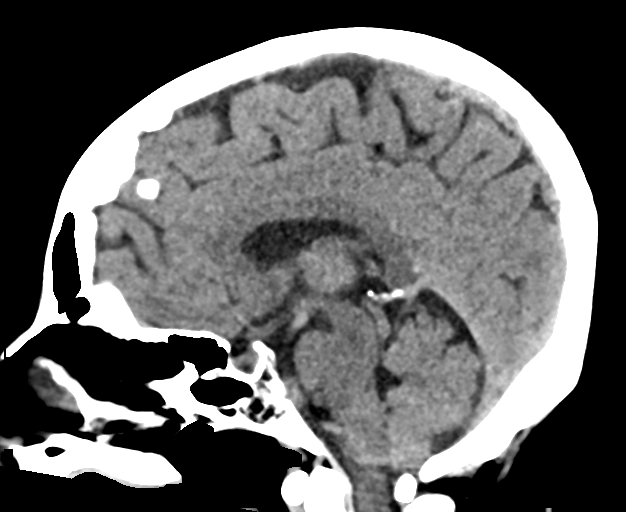
[im 41/62  brain]
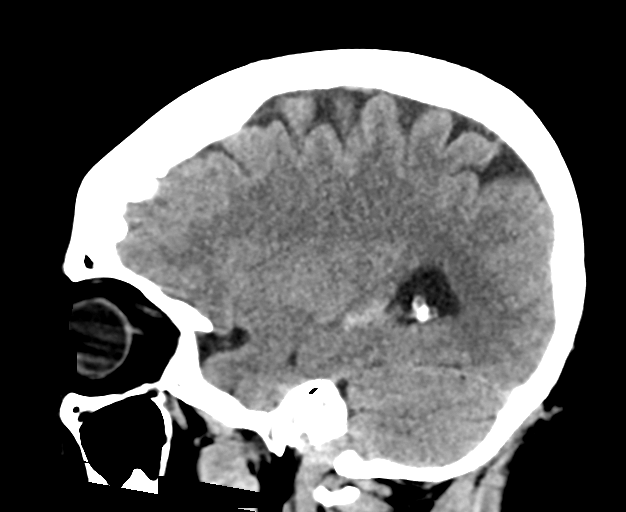

[16 of 47 positions shown; findings below may reference images not displayed]

FINDINGS: Brain: In comparison to CT head from October 09, 2008, increase in
conspicuity of abnormality along the parahippocampal medial right
temporal lobe. Area of hyperdensity in this region may represent
hyperdense tumor or hemorrhagic component. No evidence of acute
large vascular territory infarct, midline shift, hydrocephalus, or
basal cistern effacement.

Vascular: No hyperdense vessel identified.

Skull: No acute fracture.

Sinuses/Orbits: Clear sinuses.  No acute orbital findings.

Other: No mastoid effusions
IMPRESSION: In comparison to CT head from October 09, 2008, increase in conspicuity
of abnormality along the parahippocampal medial right temporal lobe.
Area of hyperdensity in this region may represent hyperdense,
mineralized, and/or hemorrhagic component. Recommend MRI with
contrast to further evaluate.

These results will be called to the ordering clinician or
representative by the Radiologist Assistant, and communication
documented in the PACS or [REDACTED].

## 2023-08-24 ENCOUNTER — Ambulatory Visit (HOSPITAL_COMMUNITY)
Admission: RE | Admit: 2023-08-24 | Discharge: 2023-08-24 | Disposition: A | Discharge status: Home / Self Care | Payer: Medicaid Other | Source: Ambulatory Visit | Attending: Family Medicine

## 2023-08-24 ENCOUNTER — Ambulatory Visit (HOSPITAL_COMMUNITY)
Admission: RE | Admit: 2023-08-24 | Discharge: 2023-08-24 | Disposition: A | Discharge status: Home / Self Care | Payer: Medicaid Other | Source: Ambulatory Visit | Attending: Family Medicine | Admitting: Family Medicine

## 2023-08-24 DIAGNOSIS — R921 Mammographic calcification found on diagnostic imaging of breast: Secondary | ICD-10-CM | POA: Diagnosis not present

## 2023-08-24 DIAGNOSIS — R928 Other abnormal and inconclusive findings on diagnostic imaging of breast: Secondary | ICD-10-CM | POA: Insufficient documentation

## 2023-08-24 DIAGNOSIS — R92331 Mammographic heterogeneous density, right breast: Secondary | ICD-10-CM | POA: Diagnosis not present

## 2023-08-24 DIAGNOSIS — R92333 Mammographic heterogeneous density, bilateral breasts: Secondary | ICD-10-CM | POA: Diagnosis not present

## 2023-08-25 ENCOUNTER — Other Ambulatory Visit (HOSPITAL_COMMUNITY): Payer: Self-pay | Admitting: Family Medicine

## 2023-08-25 ENCOUNTER — Encounter: Payer: Self-pay | Admitting: Family Medicine

## 2023-08-25 DIAGNOSIS — R928 Other abnormal and inconclusive findings on diagnostic imaging of breast: Secondary | ICD-10-CM

## 2023-08-25 DIAGNOSIS — R921 Mammographic calcification found on diagnostic imaging of breast: Secondary | ICD-10-CM

## 2023-08-30 NOTE — Telephone Encounter (Signed)
 Patient spoke with Ernestina Headland from [place] to let him know that she is still waiting to hear back from someone regarding scheduling for biopsy. Says she was advised to call us  this week if she hadnt heard anything so that's what she is doing.

## 2023-10-06 ENCOUNTER — Telehealth: Payer: Self-pay

## 2023-10-06 NOTE — Telephone Encounter (Signed)
 Left message to return call

## 2023-10-06 NOTE — Telephone Encounter (Signed)
 Called and spoke with pt , she needed updated info on her mammogram apt location and time - voiced understanding   05/28 @11 :30 at the breast center in [place]

## 2023-10-06 NOTE — Telephone Encounter (Signed)
 Copied from CRM [id number]. Topic: Appointments - Scheduling Inquiry for Clinic >> Oct 06, 2023 10:29 AM Baldemar Lev wrote: Reason for CRM: Pt has questions about her upcoming Mammogram, wants to speak with the clinic.   Best contact: [phone]

## 2023-10-13 ENCOUNTER — Ambulatory Visit
Admission: RE | Admit: 2023-10-13 | Discharge: 2023-10-13 | Disposition: A | Discharge status: Home / Self Care | Source: Ambulatory Visit | Attending: Family Medicine | Admitting: Family Medicine

## 2023-10-13 ENCOUNTER — Ambulatory Visit
Admission: RE | Admit: 2023-10-13 | Discharge: 2023-10-13 | Disposition: A | Discharge status: Home / Self Care | Source: Ambulatory Visit | Attending: Family Medicine

## 2023-10-13 DIAGNOSIS — R928 Other abnormal and inconclusive findings on diagnostic imaging of breast: Secondary | ICD-10-CM

## 2023-10-13 DIAGNOSIS — R921 Mammographic calcification found on diagnostic imaging of breast: Secondary | ICD-10-CM | POA: Diagnosis not present

## 2023-10-13 DIAGNOSIS — R92331 Mammographic heterogeneous density, right breast: Secondary | ICD-10-CM | POA: Diagnosis not present

## 2023-10-13 DIAGNOSIS — N6489 Other specified disorders of breast: Secondary | ICD-10-CM | POA: Diagnosis not present

## 2023-10-13 HISTORY — PX: BREAST BIOPSY: SHX20

## 2023-10-14 LAB — SURGICAL PATHOLOGY

## 2024-02-08 DIAGNOSIS — E785 Hyperlipidemia, unspecified: Secondary | ICD-10-CM | POA: Diagnosis not present

## 2024-03-15 ENCOUNTER — Telehealth: Payer: Self-pay

## 2024-03-15 ENCOUNTER — Ambulatory Visit (INDEPENDENT_AMBULATORY_CARE_PROVIDER_SITE_OTHER)

## 2024-03-15 ENCOUNTER — Ambulatory Visit: Payer: Medicaid Other | Admitting: Family Medicine

## 2024-03-15 ENCOUNTER — Encounter: Payer: Self-pay | Admitting: Family Medicine

## 2024-03-15 VITALS — BP 138/86 | HR 85 | Temp 97.1°F | Ht 63.0 in | Wt 126.0 lb

## 2024-03-15 DIAGNOSIS — Z0001 Encounter for general adult medical examination with abnormal findings: Secondary | ICD-10-CM

## 2024-03-15 DIAGNOSIS — Z780 Asymptomatic menopausal state: Secondary | ICD-10-CM | POA: Diagnosis not present

## 2024-03-15 DIAGNOSIS — B354 Tinea corporis: Secondary | ICD-10-CM | POA: Diagnosis not present

## 2024-03-15 DIAGNOSIS — E7800 Pure hypercholesterolemia, unspecified: Secondary | ICD-10-CM

## 2024-03-15 DIAGNOSIS — Z23 Encounter for immunization: Secondary | ICD-10-CM | POA: Diagnosis not present

## 2024-03-15 DIAGNOSIS — Z0000 Encounter for general adult medical examination without abnormal findings: Secondary | ICD-10-CM

## 2024-03-15 DIAGNOSIS — Z1211 Encounter for screening for malignant neoplasm of colon: Secondary | ICD-10-CM

## 2024-03-15 LAB — LIPID PANEL

## 2024-03-15 MED ORDER — NYSTATIN 100000 UNIT/GM EX CREA: 1.0000 | TOPICAL_CREAM | Freq: Two times a day (BID) | CUTANEOUS | 0 refills | Status: AC

## 2024-03-15 MED ORDER — ROSUVASTATIN CALCIUM 10 MG PO TABS: 10.0000 mg | ORAL_TABLET | Freq: Every day | ORAL | 3 refills | Status: AC

## 2024-03-15 NOTE — Progress Notes (Signed)
 BP 138/86   Pulse 85   Temp (!) 97.1 F (36.2 C)   Ht 5' 3 (1.6 m)   Wt 126 lb (57.2 kg)   SpO2 97%   BMI 22.32 kg/m    Subjective:   Patient ID: Gwendolyn Ortiz, female    DOB: 02-Jan-1956, 68 y.o.   MRN: [id number]  HPI: Gwendolyn Ortiz is a 68 y.o. female presenting on 03/15/2024 for Medical Management of Chronic Issues (CPE- no pap)   Discussed the use of AI scribe software for clinical note transcription with the patient, who gave verbal consent to proceed.  History of Present Illness   Gwendolyn Ortiz is a 68 year old female who presents for an annual physical exam.  Cutaneous eruptions - Rash with small bumps ('granitos') on the legs - Similar symptoms in the past resolved with topical cream - No recent changes in diet or use of new products, except for a cream from a specific product line - Previous similar rash on the hand diagnosed by a specialist - Concern for possible side effect of clorhidrostat  Neurological symptoms and scoliosis - History of scoliosis - Heaviness in the leg, attributed to a nerve issue possibly related to scoliosis - Sensation began after a stroke in 1995 - Subsequent neurological event in 2023 with normal studies  Ptosis - Drooping of the eyelids - Belief that this may be hereditary, as her grandfather had similar findings  Preventive health and screening - Recent right-sided mammogram and biopsy, both normal - Last colonoscopy approximately one year ago; prefers to return to facility in [place] for next screening - No shingles or pneumonia vaccines received  General health status - No stomach pain - Overall feeling of wellness          Relevant past medical, surgical, family and social history reviewed and updated as indicated. Interim medical history since our last visit reviewed. Allergies and medications reviewed and updated.  Review of Systems  Constitutional:  Negative for chills and fever.  HENT:  Negative for congestion,  ear discharge and ear pain.   Eyes:  Negative for redness and visual disturbance.  Respiratory:  Negative for chest tightness and shortness of breath.   Cardiovascular:  Negative for chest pain and leg swelling.  Genitourinary:  Negative for difficulty urinating and dysuria.  Musculoskeletal:  Negative for back pain and gait problem.  Skin:  Positive for rash.  Neurological:  Negative for light-headedness and headaches.  Psychiatric/Behavioral:  Negative for agitation and behavioral problems.   All other systems reviewed and are negative.   Per HPI unless specifically indicated above   Allergies as of 03/15/2024   No Known Allergies      Medication List        Accurate as of March 15, 2024 10:09 AM. If you have any questions, ask your nurse or doctor.          cholecalciferol 25 MCG (1000 UNIT) tablet Commonly known as: VITAMIN D3 Take 1,000 Units by mouth daily.   [facility] Aspirin Adult Low Dose 81 MG chewable tablet Generic drug: aspirin Chew 81 mg by mouth daily.   DAILY MULTI 50+ PO Take 1 tablet by mouth daily.   rosuvastatin 10 MG tablet Commonly known as: CRESTOR Take 1 tablet (10 mg total) by mouth daily.         Objective:   BP 138/86   Pulse 85   Temp (!) 97.1 F (36.2 C)   Ht 5' 3 (1.6 m)  Wt 126 lb (57.2 kg)   SpO2 97%   BMI 22.32 kg/m   Wt Readings from Last 3 Encounters:  03/15/24 126 lb (57.2 kg)  03/15/23 124 lb (56.2 kg)  06/15/22 129 lb (58.5 kg)    Physical Exam Vitals and nursing note reviewed.  Constitutional:      General: She is not in acute distress.    Appearance: She is well-developed. She is not diaphoretic.  Eyes:     Conjunctiva/sclera: Conjunctivae normal.     Pupils: Pupils are equal, round, and reactive to light.  Cardiovascular:     Rate and Rhythm: Normal rate and regular rhythm.     Heart sounds: Normal heart sounds. No murmur heard. Pulmonary:     Effort: Pulmonary effort is normal. No respiratory  distress.     Breath sounds: Normal breath sounds. No wheezing.  Musculoskeletal:        General: No tenderness. Normal range of motion.  Skin:    General: Skin is warm and dry.     Findings: Rash (Like scaly rash with pink edges on both of her outer legs) present.  Neurological:     Mental Status: She is alert and oriented to person, place, and time.     Coordination: Coordination normal.  Psychiatric:        Behavior: Behavior normal.       VITALS: BP- 138/86         Assessment & Plan:   Problem List Items Addressed This Visit       Other   Hyperlipidemia   Relevant Medications   rosuvastatin (CRESTOR) 10 MG tablet   Other Relevant Orders   Lipid panel   Other Visit Diagnoses       Physical exam    -  Primary   Relevant Orders   CBC with Differential/Platelet   CMP14+EGFR   Lipid panel   Ambulatory referral to Gastroenterology     Postmenopausal       Relevant Orders   DG [facility] DEXA     Colon cancer screening       Relevant Orders   Ambulatory referral to Gastroenterology     Tinea corporis              Adult Wellness Visit Routine wellness visit with normal vitals and recent normal mammogram and biopsy. - Schedule mammogram in one year.  Scoliosis with lower extremity symptoms Leg heaviness possibly due to scoliosis and nerve compression or past cerebrovascular accident. - Continue exercises to improve strength and manage symptoms.  Tinea corporis Rash with small bumps on legs, likely fungal, resolved with cream previously. - Prescribe antifungal cream to be applied twice daily for two weeks. - Advise using unscented creams and soaps to avoid irritation.  Pure hypercholesterolemia Continues clorhidrostat for cholesterol management. Rash not related to medication. - Check cholesterol levels with blood test.  Screening for malignant neoplasm of colon Due for colonoscopy, prefers previous provider in [place]. - Refer to gastroenterologist in [place] for  colonoscopy. - Expect a call within two weeks to schedule the appointment.          Follow up plan: Return in about 1 year (around 03/15/2025), or if symptoms worsen or fail to improve, for Physical exam and recheck cholesterol.  Counseling provided for all of the vaccine components Orders Placed This Encounter  Procedures   DG [facility] DEXA   CBC with Differential/Platelet   CMP14+EGFR   Lipid panel   Ambulatory referral to Gastroenterology  Fonda Levins, MD [facility] 03/15/2024, 10:09 AM

## 2024-03-15 NOTE — Telephone Encounter (Signed)
 Per Dr. Maryanne can send in Nystatin cream BID for 2w. Pt made aware.

## 2024-03-15 NOTE — Addendum Note (Signed)
 Addended by: LEIGH ROSINA SAILOR on: 03/15/2024 04:53 PM   Modules accepted: Orders

## 2024-03-15 NOTE — Telephone Encounter (Signed)
 Copied from CRM [id number]. Topic: General - Other >> Mar 15, 2024  2:53 PM Zebedee SAUNDERS wrote: Reason for CRM: Pt called stated was given a new medication but pt does not know name of what it is for. Pt would like a call back at [phone].

## 2024-03-15 NOTE — Telephone Encounter (Signed)
 I called and spoke with patient and she is talking about the antifungal cream that Dr Dettinger noted in todays visit, that he would prescribe for her. I dont see that the Rx has been sent yet. Made patient aware of this and that I would let Dr Dettinger know okay [facility].

## 2024-03-16 DIAGNOSIS — M8589 Other specified disorders of bone density and structure, multiple sites: Secondary | ICD-10-CM | POA: Diagnosis not present

## 2024-03-16 DIAGNOSIS — Z780 Asymptomatic menopausal state: Secondary | ICD-10-CM | POA: Diagnosis not present

## 2024-03-16 LAB — CMP14+EGFR
ALT: 21 IU/L (ref 0–32)
AST: 28 IU/L (ref 0–40)
Albumin: 4.5 g/dL (ref 3.9–4.9)
Alkaline Phosphatase: 67 IU/L (ref 49–135)
BUN/Creatinine Ratio: 14 (ref 12–28)
BUN: 11 mg/dL (ref 8–27)
Bilirubin Total: 0.7 mg/dL (ref 0.0–1.2)
CO2: 24 mmol/L (ref 20–29)
Calcium: 9.4 mg/dL (ref 8.7–10.3)
Chloride: 105 mmol/L (ref 96–106)
Creatinine, Ser: 0.78 mg/dL (ref 0.57–1.00)
Globulin, Total: 2.7 g/dL (ref 1.5–4.5)
Glucose: 102 mg/dL — ABNORMAL HIGH (ref 70–99)
Potassium: 3.8 mmol/L (ref 3.5–5.2)
Sodium: 143 mmol/L (ref 134–144)
Total Protein: 7.2 g/dL (ref 6.0–8.5)
eGFR: 83 mL/min/1.73 (ref >59)

## 2024-03-16 LAB — CBC WITH DIFFERENTIAL/PLATELET
Basophils Absolute: 0 x10E3/uL (ref 0.0–0.2)
Basos: 1 %
EOS (ABSOLUTE): 0.1 x10E3/uL (ref 0.0–0.4)
Eos: 2 %
Hematocrit: 42.2 % (ref 34.0–46.6)
Hemoglobin: 13.7 g/dL (ref 11.1–15.9)
Immature Grans (Abs): 0 x10E3/uL (ref 0.0–0.1)
Immature Granulocytes: 0 %
Lymphocytes Absolute: 1 x10E3/uL (ref 0.7–3.1)
Lymphs: 20 %
MCH: 31.1 pg (ref 26.6–33.0)
MCHC: 32.5 g/dL (ref 31.5–35.7)
MCV: 96 fL (ref 79–97)
Monocytes Absolute: 0.3 x10E3/uL (ref 0.1–0.9)
Monocytes: 6 %
Neutrophils Absolute: 3.7 x10E3/uL (ref 1.4–7.0)
Neutrophils: 71 %
Platelets: 202 x10E3/uL (ref 150–450)
RBC: 4.41 x10E6/uL (ref 3.77–5.28)
RDW: 12.1 % (ref 11.7–15.4)
WBC: 5.2 x10E3/uL (ref 3.4–10.8)

## 2024-03-16 LAB — LIPID PANEL
Cholesterol, Total: 130 mg/dL (ref 100–199)
HDL: 77 mg/dL (ref >39)
LDL CALC COMMENT:: 1.7 ratio (ref 0.0–4.4)
LDL Chol Calc (NIH): 42 mg/dL (ref 0–99)
Triglycerides: 48 mg/dL (ref 0–149)
VLDL Cholesterol Cal: 11 mg/dL (ref 5–40)

## 2024-03-22 ENCOUNTER — Ambulatory Visit: Payer: Self-pay | Admitting: Family Medicine

## 2024-04-12 ENCOUNTER — Encounter (INDEPENDENT_AMBULATORY_CARE_PROVIDER_SITE_OTHER): Payer: Self-pay | Admitting: *Deleted

## 2025-03-16 ENCOUNTER — Encounter: Admitting: Family Medicine
